# Patient Record
Sex: Male | Born: 1985 | Race: Black or African American | Hispanic: No | Marital: Married | Smoking: Never smoker
Health system: Southern US, Community
[De-identification: ages and names within clinical notes are randomized; demographics above are authoritative.]

## PROBLEM LIST (undated history)

## (undated) DIAGNOSIS — I10 Essential (primary) hypertension: Secondary | ICD-10-CM

## (undated) DIAGNOSIS — F419 Anxiety disorder, unspecified: Secondary | ICD-10-CM

---

## 2014-11-27 ENCOUNTER — Other Ambulatory Visit (INDEPENDENT_AMBULATORY_CARE_PROVIDER_SITE_OTHER): Payer: Self-pay | Admitting: Surgery

## 2014-11-27 ENCOUNTER — Other Ambulatory Visit (INDEPENDENT_AMBULATORY_CARE_PROVIDER_SITE_OTHER): Payer: Self-pay

## 2014-11-28 ENCOUNTER — Other Ambulatory Visit (INDEPENDENT_AMBULATORY_CARE_PROVIDER_SITE_OTHER): Payer: Self-pay

## 2014-12-02 ENCOUNTER — Other Ambulatory Visit: Payer: Self-pay | Admitting: Surgery

## 2014-12-02 LAB — CBC WITH DIFFERENTIAL/PLATELET
BASOS PCT: 0 % (ref 0–1)
Basophils Absolute: 0 10*3/uL (ref 0.0–0.1)
Eosinophils Absolute: 0.1 10*3/uL (ref 0.0–0.7)
Eosinophils Relative: 2 % (ref 0–5)
HEMATOCRIT: 47 % (ref 39.0–52.0)
Hemoglobin: 15.8 g/dL (ref 13.0–17.0)
LYMPHS ABS: 2.5 10*3/uL (ref 0.7–4.0)
Lymphocytes Relative: 39 % (ref 12–46)
MCH: 26.2 pg (ref 26.0–34.0)
MCHC: 33.6 g/dL (ref 30.0–36.0)
MCV: 77.9 fL — ABNORMAL LOW (ref 78.0–100.0)
MONO ABS: 0.4 10*3/uL (ref 0.1–1.0)
MPV: 10.9 fL (ref 8.6–12.4)
Monocytes Relative: 7 % (ref 3–12)
NEUTROS ABS: 3.3 10*3/uL (ref 1.7–7.7)
Neutrophils Relative %: 52 % (ref 43–77)
Platelets: 202 10*3/uL (ref 150–400)
RBC: 6.03 MIL/uL — AB (ref 4.22–5.81)
RDW: 15.3 % (ref 11.5–15.5)
WBC: 6.3 10*3/uL (ref 4.0–10.5)

## 2014-12-02 LAB — COMPREHENSIVE METABOLIC PANEL
ALT: 14 U/L (ref 0–53)
AST: 18 U/L (ref 0–37)
Albumin: 4.3 g/dL (ref 3.5–5.2)
Alkaline Phosphatase: 71 U/L (ref 39–117)
BUN: 15 mg/dL (ref 6–23)
CHLORIDE: 107 meq/L (ref 96–112)
CO2: 28 mEq/L (ref 19–32)
Calcium: 9.9 mg/dL (ref 8.4–10.5)
Creat: 1.23 mg/dL (ref 0.50–1.35)
Glucose, Bld: 120 mg/dL — ABNORMAL HIGH (ref 70–99)
Potassium: 5 mEq/L (ref 3.5–5.3)
Sodium: 145 mEq/L (ref 135–145)
Total Bilirubin: 0.6 mg/dL (ref 0.2–1.2)
Total Protein: 6.9 g/dL (ref 6.0–8.3)

## 2014-12-02 LAB — TSH: TSH: 1.427 u[IU]/mL (ref 0.350–4.500)

## 2014-12-17 ENCOUNTER — Other Ambulatory Visit (HOSPITAL_COMMUNITY): Payer: Self-pay

## 2014-12-17 ENCOUNTER — Encounter: Payer: BLUE CROSS/BLUE SHIELD | Attending: Surgery | Admitting: Dietician

## 2014-12-17 DIAGNOSIS — Z6841 Body Mass Index (BMI) 40.0 and over, adult: Secondary | ICD-10-CM | POA: Diagnosis not present

## 2014-12-17 DIAGNOSIS — Z713 Dietary counseling and surveillance: Secondary | ICD-10-CM | POA: Insufficient documentation

## 2014-12-17 NOTE — Patient Instructions (Signed)
Follow Pre-Op Goals Try Protein Shakes Call [facility] at [phone] when surgery is scheduled to enroll in Pre-Op Class  Things to remember:  Please always be honest with us. We want to support you!  If you have any questions or concerns in between appointments, please call or email Liz, Edgard Debord, or Laurie.  The diet after surgery will be high protein and low in carbohydrate.  Vitamins and calcium need to be taken for the rest of your life.  Feel free to include support people in any classes or appointments.  

## 2014-12-17 NOTE — Progress Notes (Signed)
  Pre-Op Assessment Visit:  Pre-Operative Gastric sleeve Surgery  Medical Nutrition Therapy:  Appt start time: 1120   End time:  1200.  Patient was seen on 12/17/14 for Pre-Operative Nutrition Assessment. [facility] on 12/17/14.   Preferred Learning Style:   No preference indicated   Learning Readiness:   Ready  Handouts given during visit include:  Pre-Op Goals Bariatric Surgery Protein Shakes  Samples provided and patient instructed on proper use: Bariatric Advantage Calcium citrate (orange - qty 2) Lot#: [id number] Exp: 03/2015  During the appointment today the following Pre-Op Goals were reviewed with the patient: Maintain or lose weight as instructed by your surgeon Make healthy food choices Begin to limit portion sizes Limited concentrated sugars and fried foods Keep fat/sugar in the single digits per serving on   food labels Practice CHEWING your food  (aim for 30 chews per bite or until applesauce consistency) Practice not drinking 15 minutes before, during, and 30 minutes after each meal/snack Avoid all carbonated beverages  Avoid/limit caffeinated beverages  Avoid all sugar-sweetened beverages Consume 3 meals per day; eat every 3-5 hours Make a list of non-food related activities Aim for 64-100 ounces of FLUID daily  Aim for at least 60-80 grams of PROTEIN daily Look for a liquid protein source that contain ?15 g protein and ?5 g carbohydrate  (ex: shakes, drinks, shots)  Patient-Centered Goals: -Running without pain, feeling more confident, tying shoe without being out of breath, sleeping more comfortably  Scale of 1-10: confidence (10) /importance scale (10)  Demonstrated degree of understanding via:  Teach Back  Teaching Method Utilized:  Visual Auditory Hands on  Barriers to learning/adherence to lifestyle change: none  Patient to call the [facility] to enroll in Pre-Op and Post-Op Nutrition Education when surgery date is scheduled.

## 2014-12-20 ENCOUNTER — Ambulatory Visit (HOSPITAL_COMMUNITY)
Admission: RE | Admit: 2014-12-20 | Discharge: 2014-12-20 | Disposition: A | Discharge status: Home / Self Care | Payer: BLUE CROSS/BLUE SHIELD | Source: Ambulatory Visit | Attending: Surgery | Admitting: Surgery

## 2014-12-20 DIAGNOSIS — Z6841 Body Mass Index (BMI) 40.0 and over, adult: Secondary | ICD-10-CM | POA: Insufficient documentation

## 2014-12-20 DIAGNOSIS — Z01818 Encounter for other preprocedural examination: Secondary | ICD-10-CM | POA: Insufficient documentation

## 2014-12-27 ENCOUNTER — Ambulatory Visit (HOSPITAL_COMMUNITY)
Admission: RE | Admit: 2014-12-27 | Discharge: 2014-12-27 | Disposition: A | Discharge status: Home / Self Care | Payer: BLUE CROSS/BLUE SHIELD | Source: Ambulatory Visit | Attending: Surgery | Admitting: Surgery

## 2014-12-27 ENCOUNTER — Encounter (HOSPITAL_COMMUNITY)
Admission: RE | Disposition: A | Discharge status: Home / Self Care | Payer: BLUE CROSS/BLUE SHIELD | Source: Ambulatory Visit | Attending: Surgery

## 2014-12-27 HISTORY — PX: BREATH TEK H PYLORI: SHX5422

## 2014-12-27 SURGERY — BREATH TEST, FOR HELICOBACTER PYLORI

## 2014-12-27 NOTE — Progress Notes (Signed)
   12/27/14 1025  BREATH TEK ASSESSMENT  Referring MD Dr. Ezzard StandingNewman  Time of Last PO Intake 2300  Baseline Breath At: 0822  Pranactin Given At: 0823  Post-Dose Breath At: 0838  Sample 1 3.9  Sample 2 2.7  Test Negative

## 2014-12-30 ENCOUNTER — Encounter (HOSPITAL_COMMUNITY): Payer: Self-pay | Admitting: Surgery

## 2015-01-15 ENCOUNTER — Encounter: Payer: BLUE CROSS/BLUE SHIELD | Attending: Surgery | Admitting: Dietician

## 2015-01-15 DIAGNOSIS — Z6841 Body Mass Index (BMI) 40.0 and over, adult: Secondary | ICD-10-CM | POA: Diagnosis not present

## 2015-01-15 DIAGNOSIS — Z713 Dietary counseling and surveillance: Secondary | ICD-10-CM | POA: Diagnosis not present

## 2015-01-15 NOTE — Progress Notes (Signed)
Supervised Weight Loss:  Appt start time: 1105 end time:  1120.  SWL visit #1:  Primary concerns today: Cody Byrd is here today for his 1st SWL visit in preparation for sleeve gastrectomy. He tried Premier protein shake and liked it okay, plans to explore other options. Wants to try Isopure. Having a hard time remembering to chew thoroughly. Began eating quickly when he was in [employer] and still in that habit.  Weight: 309 lbs BMI: 47.1  Plan: Practice eating slowly and chewing thoroughly  MEDICATIONS: Valsartan  Estimated energy needs: 2000-2200 calories  Progress Towards Goal(s):  In progress.   Nutritional Diagnosis:  Cody Byrd Overweight/obesity related to past poor dietary habits and physical inactivity as evidenced by patient in SWL for pending bariatric surgery following dietary guidelines for continued weight loss.     Intervention:  Nutrition counseling provided.  Monitoring/Evaluation:  Dietary intake, exercise, pre op goals, and body weight in 4 week(s).

## 2015-02-20 ENCOUNTER — Encounter: Payer: BLUE CROSS/BLUE SHIELD | Attending: Surgery | Admitting: Dietician

## 2015-02-20 VITALS — Ht 68.0 in | Wt 302.6 lb

## 2015-02-20 DIAGNOSIS — E669 Obesity, unspecified: Secondary | ICD-10-CM

## 2015-02-20 DIAGNOSIS — Z713 Dietary counseling and surveillance: Secondary | ICD-10-CM | POA: Diagnosis not present

## 2015-02-20 DIAGNOSIS — Z6841 Body Mass Index (BMI) 40.0 and over, adult: Secondary | ICD-10-CM | POA: Insufficient documentation

## 2015-02-20 NOTE — Progress Notes (Signed)
Supervised Weight Loss:  Appt start time: 1145 end time:  1200  SWL visit #2:  Primary concerns today: Cody Byrd is here today for his 2nd SWL visit in preparation for sleeve gastrectomy having lost 7 pounds since last month by increasing vegetables and reducing carbs. Still having trouble chewing thoroughly and eating slowly. Has tried replacing breakfast or dinner with a Premier protein shake. Was doing water aerobics but stopped to focus more on weight training. Plans to resume water aerobics for joint pain relief.    Weight: 302.6 lbs BMI: 46.1  Plan: Practice eating slowly and chewing thoroughly  -Try putting fork down between each bite   MEDICATIONS: Valsartan  Estimated energy needs: 2000-2200 calories  Progress Towards Goal(s):  In progress.   Nutritional Diagnosis:  [place]-3.3 Overweight/obesity related to past poor dietary habits and physical inactivity as evidenced by patient in SWL for pending bariatric surgery following dietary guidelines for continued weight loss.     Intervention:  Nutrition counseling provided.  Monitoring/Evaluation:  Dietary intake, exercise, pre op goals, and body weight in 4 week(s).

## 2015-03-19 ENCOUNTER — Encounter: Payer: BLUE CROSS/BLUE SHIELD | Attending: Surgery | Admitting: Dietician

## 2015-03-19 ENCOUNTER — Encounter: Payer: Self-pay | Admitting: Dietician

## 2015-03-19 DIAGNOSIS — Z713 Dietary counseling and surveillance: Secondary | ICD-10-CM | POA: Diagnosis not present

## 2015-03-19 DIAGNOSIS — Z6841 Body Mass Index (BMI) 40.0 and over, adult: Secondary | ICD-10-CM | POA: Insufficient documentation

## 2015-03-19 NOTE — Patient Instructions (Addendum)
Practice eating slowly and chewing thoroughly  -Try putting fork down between each bite Plan to eat 3-4 x day (instead of 2). Work on waiting 30 minutes after meals before drinking.

## 2015-03-19 NOTE — Progress Notes (Signed)
Supervised Weight Loss:  Appt start time: 1100 end time:  1115  SWL visit #3:  Primary concerns today: Cody Byrd is here today for his 3rd SWL visit in preparation for sleeve gastrectomy having lost 6 pounds since last month. Doing more cardio 3-4 x week. Has also been working on chewing. Has done some water aerobics too. Eating about 2 meals per day d/t work schedule. Tried Premier and [employer]ure and liked them ok. Drinking water and had a few beers in the past month. Not drinking during meals.  Weight: 296.9 lbs BMI: 46.0  Plan:  Practice eating slowly and chewing thoroughly  -Try putting fork down between each bite Plan to eat 3-4 x day (instead of 2). Work on waiting 30 minutes after meals before drinking.  MEDICATIONS: Valsartan  Estimated energy needs: 2000-2200 calories  Progress Towards Goal(s):  In progress.   Nutritional Diagnosis:  [place]-3.3 Overweight/obesity related to past poor dietary habits and physical inactivity as evidenced by patient in SWL for pending bariatric surgery following dietary guidelines for continued weight loss.     Intervention:  Nutrition counseling provided.  Monitoring/Evaluation:  Dietary intake, exercise, pre op goals, and body weight in 4 week(s).

## 2015-04-17 ENCOUNTER — Ambulatory Visit: Payer: BLUE CROSS/BLUE SHIELD | Admitting: Dietician

## 2015-04-22 ENCOUNTER — Encounter: Payer: Self-pay | Admitting: Dietician

## 2015-04-22 ENCOUNTER — Encounter: Payer: BLUE CROSS/BLUE SHIELD | Attending: Surgery | Admitting: Dietician

## 2015-04-22 VITALS — Ht 68.0 in | Wt 292.2 lb

## 2015-04-22 DIAGNOSIS — Z6841 Body Mass Index (BMI) 40.0 and over, adult: Secondary | ICD-10-CM | POA: Diagnosis not present

## 2015-04-22 DIAGNOSIS — E669 Obesity, unspecified: Secondary | ICD-10-CM

## 2015-04-22 DIAGNOSIS — Z713 Dietary counseling and surveillance: Secondary | ICD-10-CM | POA: Diagnosis not present

## 2015-04-22 NOTE — Patient Instructions (Addendum)
Practice eating slowly and chewing thoroughly  -Try putting fork down between each bite Continue to eat 3-4 x day. Plan to do cardio 3-4 x for 45-60 minutes. [facility] at [phone] when surgery is scheduled to enroll in Pre-Op Class

## 2015-04-22 NOTE — Progress Notes (Signed)
Supervised Weight Loss:  Appt start time: 200 end time:  215  SWL visit #4:  Primary concerns today: Cody Byrd is here today for his 4th SWL visit in preparation for sleeve gastrectomy having lost 4 pounds since last month. Has been doing cardio 2 x week this month. Still working on chewing and putting his fork down between bites. Has been improving portion control. Waiting 30 minutes to drink after meals.  Added a protein shake so having 3 meals per day.   Weight: 292.2 lbs BMI: 44.3  Plan:  Practice eating slowly and chewing thoroughly  -Try putting fork down between each bite Continue to eat 3-4 x day. Plan to do cardio 3-4 x for 45-60 minutes. [facility] [phone] when surgery is scheduled to enroll in Pre-Op Class  MEDICATIONS: Valsartan  Estimated energy needs: 2000-2200 calories  Progress Towards Goal(s):  In progress.   Nutritional Diagnosis:  [place]-3.3 Overweight/obesity related to past poor dietary habits and physical inactivity as evidenced by patient in SWL for pending bariatric surgery following dietary guidelines for continued weight loss.     Intervention:  Nutrition counseling provided.  Monitoring/Evaluation:  Dietary intake, exercise, pre op goals, and body weight in 4 week(s).

## 2015-05-21 ENCOUNTER — Ambulatory Visit: Payer: BLUE CROSS/BLUE SHIELD | Admitting: Dietician

## 2015-06-16 ENCOUNTER — Encounter: Payer: BLUE CROSS/BLUE SHIELD | Attending: Surgery

## 2015-06-16 DIAGNOSIS — Z6841 Body Mass Index (BMI) 40.0 and over, adult: Secondary | ICD-10-CM | POA: Insufficient documentation

## 2015-06-16 DIAGNOSIS — Z713 Dietary counseling and surveillance: Secondary | ICD-10-CM | POA: Insufficient documentation

## 2015-06-16 NOTE — Progress Notes (Signed)
  Pre-Operative Nutrition Class:  Appt start time: 830   End time:  930.  Patient was seen on 06/16/2015 for Pre-Operative Bariatric Surgery Education at the [facility].   Surgery date:  Surgery type: Sleeve gastrectomy Start weight at [facility]: 309 lbs on 12/17/14 Weight today: 291 lbs  TANITA  BODY COMP RESULTS  06/16/15   BMI (kg/m^2) 44.2   Fat Mass (lbs) 153   Fat Free Mass (lbs) 138   Total Body Water (lbs) 101   Samples given per MNT protocol. Patient educated on appropriate usage: Premier protein shake (chocolate - qty 1) Lot #: [id number] Exp: 02/2016  Unjury protein powder (chicken soup - qty 1) Lot #: [id number] Exp: 05/2016  Celebrate Calcium citrate chew (caramel - qty 1) Lot #: [id number] Exp: 01/2017  The following the learning objectives were met by the patient during this course:  Identify Pre-Op Dietary Goals and will begin 2 weeks pre-operatively  Identify appropriate sources of fluids and proteins   State protein recommendations and appropriate sources pre and post-operatively  Identify Post-Operative Dietary Goals and will follow for 2 weeks post-operatively  Identify appropriate multivitamin and calcium sources  Describe the need for physical activity post-operatively and will follow MD recommendations  State when to call healthcare provider regarding medication questions or post-operative complications  Handouts given during class include:  Pre-Op Bariatric Surgery Diet Handout  Protein Shake Handout  Post-Op Bariatric Surgery Nutrition Handout  BELT Program Information Flyer  Support Group Information Flyer  [facility] Bariatric Supplements Price List  Follow-Up Plan: Patient will follow-up at [facility] 2 weeks post operatively for diet advancement per MD.

## 2015-06-19 ENCOUNTER — Ambulatory Visit: Payer: BLUE CROSS/BLUE SHIELD | Admitting: Dietician

## 2015-06-19 ENCOUNTER — Other Ambulatory Visit: Payer: Self-pay | Admitting: Surgery

## 2015-07-02 NOTE — Patient Instructions (Addendum)
YOUR PROCEDURE IS SCHEDULED ON :  07/07/15  REPORT TO [facility] MAIN ENTRANCE FOLLOW SIGNS TO EAST ELEVATOR - GO TO 3rd FLOOR CHECK IN AT 3 EAST NURSES STATION (SHORT STAY) AT:  10:45 AM  CALL THIS NUMBER IF YOU HAVE PROBLEMS THE MORNING OF SURGERY [phone]  REMEMBER:ONLY 1 PER PERSON MAY GO TO SHORT STAY WITH YOU TO GET READY THE MORNING OF YOUR SURGERY  DO NOT EAT FOOD OR DRINK LIQUIDS AFTER MIDNIGHT  TAKE THESE MEDICINES THE MORNING OF SURGERY: NONE  YOU MAY NOT HAVE ANY METAL ON YOUR BODY INCLUDING HAIR PINS AND PIERCING'S. DO NOT WEAR JEWELRY, MAKEUP, LOTIONS, POWDERS OR PERFUMES. DO NOT WEAR NAIL POLISH. DO NOT SHAVE 48 HRS PRIOR TO SURGERY. MEN MAY SHAVE FACE AND NECK.  DO NOT BRING VALUABLES TO HOSPITAL. [facility] IS NOT RESPONSIBLE FOR VALUABLES.  CONTACTS, DENTURES OR PARTIALS MAY NOT BE WORN TO SURGERY. LEAVE SUITCASE IN CAR. CAN BE BROUGHT TO ROOM AFTER SURGERY.  PATIENTS DISCHARGED THE DAY OF SURGERY WILL NOT BE ALLOWED TO DRIVE HOME.  PLEASE READ OVER THE FOLLOWING INSTRUCTION SHEETS _________________________________________________________________________________                                          [facility] - PREPARING FOR SURGERY  Before surgery, you can play an important role.  Because skin is not sterile, your skin needs to be as free of germs as possible.  You can reduce the number of germs on your skin by washing with CHG (chlorahexidine gluconate) soap before surgery.  CHG is an antiseptic cleaner which kills germs and bonds with the skin to continue killing germs even after washing. Please DO NOT use if you have an allergy to CHG or antibacterial soaps.  If your skin becomes reddened/irritated stop using the CHG and inform your nurse when you arrive at Short Stay. Do not shave (including legs and underarms) for at least 48 hours prior to the first CHG shower.  You may shave your face. Please follow these instructions  carefully:   1.  Shower with CHG Soap the night before surgery and the  morning of Surgery.   2.  If you choose to wash your hair, wash your hair first as usual with your  normal  Shampoo.   3.  After you shampoo, rinse your hair and body thoroughly to remove the  shampoo.                                         4.  Use CHG as you would any other liquid soap.  You can apply chg directly  to the skin and wash . Gently wash with scrungie or clean wascloth    5.  Apply the CHG Soap to your body ONLY FROM THE NECK DOWN.   Do not use on open                           Wound or open sores. Avoid contact with eyes, ears mouth and genitals (private parts).                        Genitals (private parts) with your normal soap.  6.  Wash thoroughly, paying special attention to the area where your surgery  will be performed.   7.  Thoroughly rinse your body with warm water from the neck down.   8.  DO NOT shower/wash with your normal soap after using and rinsing off  the CHG Soap .                9.  Pat yourself dry with a clean towel.             10.  Wear clean night clothes to bed after shower             11.  Place clean sheets on your bed the night of your first shower and do not  sleep with pets.  Day of Surgery : Do not apply any lotions/deodorants the morning of surgery.  Please wear clean clothes to the hospital/surgery center.  FAILURE TO FOLLOW THESE INSTRUCTIONS MAY RESULT IN THE CANCELLATION OF YOUR SURGERY    PATIENT SIGNATURE_________________________________  ______________________________________________________________________  MAY HAVE CLEAR LIQUIDS UNTIL 6:45 AM    CLEAR LIQUID DIET   Foods Allowed                                                                     Foods Excluded  Coffee and tea, regular and decaf                             liquids that you cannot  Plain Jell-O in any flavor                                             see through  such as: Fruit ices (not with fruit pulp)                                     milk, soups, orange juice  Iced Popsicles                                    All solid food Carbonated beverages, regular and diet                                    Cranberry, grape and apple juices Sports drinks like Gatorade Lightly seasoned clear broth or consume(fat free) Sugar, honey syrup   _____________________________________________________________________

## 2015-07-03 ENCOUNTER — Encounter (HOSPITAL_COMMUNITY)
Admission: RE | Admit: 2015-07-03 | Discharge: 2015-07-03 | Disposition: A | Discharge status: Home / Self Care | Payer: BLUE CROSS/BLUE SHIELD | Source: Ambulatory Visit | Attending: Surgery | Admitting: Surgery

## 2015-07-03 ENCOUNTER — Encounter (HOSPITAL_COMMUNITY): Payer: Self-pay

## 2015-07-03 ENCOUNTER — Encounter (INDEPENDENT_AMBULATORY_CARE_PROVIDER_SITE_OTHER): Payer: Self-pay

## 2015-07-03 DIAGNOSIS — Z01818 Encounter for other preprocedural examination: Secondary | ICD-10-CM | POA: Insufficient documentation

## 2015-07-03 HISTORY — DX: Anxiety disorder, unspecified: F41.9

## 2015-07-03 HISTORY — DX: Essential (primary) hypertension: I10

## 2015-07-03 LAB — COMPREHENSIVE METABOLIC PANEL
ALK PHOS: 52 U/L (ref 38–126)
ALT: 24 U/L (ref 17–63)
AST: 37 U/L (ref 15–41)
Albumin: 4.7 g/dL (ref 3.5–5.0)
Anion gap: 10 (ref 5–15)
BUN: 21 mg/dL — ABNORMAL HIGH (ref 6–20)
CALCIUM: 9.5 mg/dL (ref 8.9–10.3)
CO2: 24 mmol/L (ref 22–32)
CREATININE: 1.32 mg/dL — AB (ref 0.61–1.24)
Chloride: 105 mmol/L (ref 101–111)
GFR calc Af Amer: >60 mL/min (ref >60)
GFR calc non Af Amer: >60 mL/min (ref >60)
Glucose, Bld: 96 mg/dL (ref 65–99)
Potassium: 4.7 mmol/L (ref 3.5–5.1)
SODIUM: 139 mmol/L (ref 135–145)
Total Bilirubin: 0.7 mg/dL (ref 0.3–1.2)
Total Protein: 7.2 g/dL (ref 6.5–8.1)

## 2015-07-03 LAB — CBC WITH DIFFERENTIAL/PLATELET
BASOS ABS: 0 10*3/uL (ref 0.0–0.1)
BASOS PCT: 0 % (ref 0–1)
EOS ABS: 0.1 10*3/uL (ref 0.0–0.7)
Eosinophils Relative: 2 % (ref 0–5)
HCT: 44.5 % (ref 39.0–52.0)
HEMOGLOBIN: 15.2 g/dL (ref 13.0–17.0)
Lymphocytes Relative: 48 % — ABNORMAL HIGH (ref 12–46)
Lymphs Abs: 2.6 10*3/uL (ref 0.7–4.0)
MCH: 25.9 pg — ABNORMAL LOW (ref 26.0–34.0)
MCHC: 34.2 g/dL (ref 30.0–36.0)
MCV: 75.9 fL — ABNORMAL LOW (ref 78.0–100.0)
Monocytes Absolute: 0.3 10*3/uL (ref 0.1–1.0)
Monocytes Relative: 6 % (ref 3–12)
NEUTROS PCT: 44 % (ref 43–77)
Neutro Abs: 2.4 10*3/uL (ref 1.7–7.7)
Platelets: 194 10*3/uL (ref 150–400)
RBC: 5.86 MIL/uL — AB (ref 4.22–5.81)
RDW: 14.2 % (ref 11.5–15.5)
WBC: 5.4 10*3/uL (ref 4.0–10.5)

## 2015-07-03 NOTE — Progress Notes (Signed)
   07/03/15 1417  OBSTRUCTIVE SLEEP APNEA  Have you ever been diagnosed with sleep apnea through a sleep study? No  Do you snore loudly (loud enough to be heard through closed doors)?  0  Do you often feel tired, fatigued, or sleepy during the daytime? 1  Has anyone observed you stop breathing during your sleep? 1  Do you have, or are you being treated for high blood pressure? 1  BMI more than 35 kg/m2? 1  Age over 28 years old? 0  Neck circumference greater than 40 cm/16 inches? 1  Gender: 1  Obstructive Sleep Apnea Score 6

## 2015-07-06 NOTE — H&P (Signed)
Cody Byrd 06/19/2015 1:37 PM Location: [facility] Patient #: [id number] DOB: [birth year] Married / Language: English / Race: Black or African American Male  History of Present Illness   Patient words: preop.   The patient is a 29 year old male who presents for a bariatric surgery evaluation.   He does not have a PCP.  He comes by himself.   His initial weight is 305. His BMI is 47.54. He has lost almost 18 pounds since I saw him. He is ready. We talked about his wife, who is going through the process of getting approved. She just had her last surpervised diet.  He has been trying to loose weight. I think that he has a good handle on the surgery. He has looked at several on line videos and had some specific questions about the surgery. I reviewed the operation with him. He is to start his pre op diet soon.  Per the 1991 NIH Consensus Statement, the patient is a candidate for bariatric surgery. The patient attended our initial information session and reviewed the types of bariatric surgery.  The patient is interested in the sleeve gastrectomy. I discussed with the patient the indications and risks of bariatric surgery. The potential risks of surgery include, but are not limited to, bleeding, infection, leak from the bowel, DVT and PE, open surgery, long term nutrition consequences, and death. The patient understands the importance of compliance and long term follow-up with our group after surgery.  UGI - 12/20/2014 - negative [place] of abdomen - 12/20/2014 - fatty liver Psych - he saw Dr. Cyndia Skeeters - 01/30/2015  History of weight issues: The patient's wieght issues began when he got out of the [employer]. He said he was not as active, but kept eating the same amount. Even when he diets, he'll loose a little, then gain more wieght back. The patient is interested in laparoscopic sleeve resection. He does not know a patient who has had the lap sleeve  resection. I presented the information session he went to about 2 months ago. He has tried the [employer], intermittent fasting, liquid diet, and protein shakes. He also tried an over-the-counter red pill, but does not remember the name of it. I am to see his wife tomorrow for consideration for weight loss surgery.  Past Medical History 1. HTN - he says that he treats it with mustard. 2. Musculoskelatal - he has back and knee pain. He was seen in [employer] before he left, but has not seen someone locally. 3. Psych - Dr. Cyndia Skeeters 4. Has claustrophobia  Socail History: Married. His wife is Print production planner. I am seeing his wife for a sleeve also. He works for [employer]. Addendum Note(Hulbert Branscome H. Ezzard Standing MD; 06/19/2015 2:23 PM) Dr. Burney Gauze is his PCP now. DN 06/19/2015   Allergies (Ammie Eversole, LPN; 12/16/3084 5:78 PM) No Known Drug Allergies01/13/2016  Medication History (Ammie Eversole, LPN; 02/19/9628 5:28 PM) Valsartan (80MG  Tablet, Oral) Active. No Current Medications (Taken starting 06/19/2015) Medications Reconciled  Review of Systems Onalee Hua H. Ezzard Standing MD; 06/19/2015 1:53 PM) General Present- Fatigue and Weight Gain. Not Present- Appetite Loss, Chills, Fever, Night Sweats and Weight Loss. Skin Present- Dryness. Not Present- Change in Wart/Mole, Hives, Jaundice, New Lesions, Non-Healing Wounds, Rash and Ulcer. HEENT Present- Visual Disturbances. Not Present- Earache, Hearing Loss, Hoarseness, Nose Bleed, Oral Ulcers, Ringing in the Ears, Seasonal Allergies, Sinus Pain, Sore Throat, Wears glasses/contact lenses and Yellow Eyes. Respiratory Present- Snoring. Not Present- Bloody sputum, Chronic Cough, Difficulty  Breathing and Wheezing. Breast Not Present- Breast Mass, Breast Pain, Nipple Discharge and Skin Changes. Cardiovascular Present- Difficulty Breathing Lying Down and Leg Cramps. Not Present- Chest Pain, Palpitations, Rapid Heart Rate, Shortness of Breath and Swelling of  Extremities. Gastrointestinal Present- Excessive gas. Not Present- Abdominal Pain, Bloating, Bloody Stool, Change in Bowel Habits, Chronic diarrhea, Constipation, Difficulty Swallowing, Gets full quickly at meals, Hemorrhoids, Indigestion, Nausea, Rectal Pain and Vomiting. Male Genitourinary Present- Nocturia. Not Present- Blood in Urine, Change in Urinary Stream, Frequency, Impotence, Painful Urination, Urgency and Urine Leakage. Musculoskeletal Present- Back Pain, Joint Pain and Joint Stiffness. Not Present- Muscle Pain, Muscle Weakness and Swelling of Extremities. Neurological Not Present- Decreased Memory, Fainting, Headaches, Numbness, Seizures, Tingling, Tremor, Trouble walking and Weakness. Endocrine Present- Hair Changes. Not Present- Cold Intolerance, Excessive Hunger, Heat Intolerance and New Diabetes. Hematology Not Present- Easy Bruising, Excessive bleeding, Gland problems, HIV and Persistent Infections.   Vitals (Ammie Eversole LPN; 11/20/1094 0:45 PM) 06/19/2015 1:37 PM Weight: 287.8 lb Height: 67.25in Body Surface Area: 2.49 m Body Mass Index: 44.74 kg/m Temp.: 98.46F(Oral)  Pulse: 78 (Regular)  BP: 126/88 (Sitting, Left Arm, Standard)  Physical Exam  General: Obese WN AA alert and generally healthy appearing. HEENT: Normal. Pupils equal. Good dentition.  Neck: Supple. No mass. No thyroid mass.  Lymph Nodes: No supraclavicular or cervical nodes.  Lungs: Clear to auscultation and symmetric breath sounds. He does have breasts secondary to his weight bilaterally. Heart: RRR. No murmur or rub.  Abdomen: Soft. No mass. No tenderness. No hernia. Normal bowel sounds. No abdominal scars. He is about 1/2 apple and 1/2 pear.  Extremities: Good strength and ROM in upper and lower extremities.  Neurologic: Grossly intact to motor and sensory function. Psychiatric: Has normal mood and affect. Behavior is normal.  Assessment & Plan  1.  MORBID OBESITY WITH BMI  OF 45.0-49.9, ADULT (278.01  E66.01)  Impression: He is scheduled for sleeve gastrectomy - 07/07/2015   He is on the pre op diet.  Gave him his pain meds: OxyCODONE HCl /5ML, 5-10 Milliliter every four hours, as needed, 200 Milliliter, 06/19/2015, No Refill.  2. HTN - he says that he treats it with mustard. 3. Musculoskelatal - he has back and knee pain. He was [place] in [employer] before he left, but has not seen someone locally. 4. Has claustrophobia  5.  I am seeing his wife, Ester Mabe, for weight loss surgery.  Ovidio Kin, MD, [facility] Pager: [phone] Office phone:  [phone]

## 2015-07-07 ENCOUNTER — Encounter (HOSPITAL_COMMUNITY): Payer: Self-pay | Admitting: Anesthesiology

## 2015-07-07 ENCOUNTER — Inpatient Hospital Stay (HOSPITAL_COMMUNITY): Payer: BLUE CROSS/BLUE SHIELD | Admitting: Anesthesiology

## 2015-07-07 ENCOUNTER — Inpatient Hospital Stay (HOSPITAL_COMMUNITY)
Admission: RE | Admit: 2015-07-07 | Discharge: 2015-07-09 | DRG: 621 | Disposition: A | Discharge status: Home / Self Care | Payer: BLUE CROSS/BLUE SHIELD | Source: Ambulatory Visit | Attending: Surgery | Admitting: Surgery

## 2015-07-07 ENCOUNTER — Encounter (HOSPITAL_COMMUNITY)
Admission: RE | Disposition: A | Discharge status: Home / Self Care | Payer: Self-pay | Source: Ambulatory Visit | Attending: Surgery

## 2015-07-07 DIAGNOSIS — K660 Peritoneal adhesions (postprocedural) (postinfection): Secondary | ICD-10-CM | POA: Diagnosis present

## 2015-07-07 DIAGNOSIS — Z713 Dietary counseling and surveillance: Secondary | ICD-10-CM | POA: Diagnosis present

## 2015-07-07 DIAGNOSIS — M25569 Pain in unspecified knee: Secondary | ICD-10-CM | POA: Diagnosis present

## 2015-07-07 DIAGNOSIS — Z01812 Encounter for preprocedural laboratory examination: Secondary | ICD-10-CM | POA: Diagnosis not present

## 2015-07-07 DIAGNOSIS — Z6841 Body Mass Index (BMI) 40.0 and over, adult: Secondary | ICD-10-CM | POA: Diagnosis not present

## 2015-07-07 DIAGNOSIS — R110 Nausea: Secondary | ICD-10-CM | POA: Diagnosis present

## 2015-07-07 DIAGNOSIS — Z79899 Other long term (current) drug therapy: Secondary | ICD-10-CM | POA: Diagnosis not present

## 2015-07-07 DIAGNOSIS — F40240 Claustrophobia: Secondary | ICD-10-CM | POA: Diagnosis present

## 2015-07-07 DIAGNOSIS — K760 Fatty (change of) liver, not elsewhere classified: Secondary | ICD-10-CM | POA: Diagnosis present

## 2015-07-07 DIAGNOSIS — I10 Essential (primary) hypertension: Secondary | ICD-10-CM | POA: Diagnosis present

## 2015-07-07 DIAGNOSIS — Z9884 Bariatric surgery status: Secondary | ICD-10-CM

## 2015-07-07 DIAGNOSIS — Z01810 Encounter for preprocedural cardiovascular examination: Secondary | ICD-10-CM | POA: Diagnosis not present

## 2015-07-07 HISTORY — PX: LAPAROSCOPIC GASTRIC SLEEVE RESECTION: SHX5895

## 2015-07-07 LAB — HEMOGLOBIN AND HEMATOCRIT, BLOOD
HCT: 43.2 % (ref 39.0–52.0)
Hemoglobin: 14.6 g/dL (ref 13.0–17.0)

## 2015-07-07 LAB — CREATININE, SERUM
Creatinine, Ser: 1.32 mg/dL — ABNORMAL HIGH (ref 0.61–1.24)
GFR calc Af Amer: >60 mL/min (ref >60)

## 2015-07-07 SURGERY — GASTRECTOMY, SLEEVE, LAPAROSCOPIC
Anesthesia: General

## 2015-07-07 MED ORDER — TISSEEL VH 10 ML EX KIT
PACK | CUTANEOUS | Status: AC
  Filled 2015-07-07: qty 1

## 2015-07-07 MED ORDER — MIDAZOLAM HCL 5 MG/5ML IJ SOLN
INTRAMUSCULAR | Status: DC | PRN
  Administered 2015-07-07: 2 mg via INTRAVENOUS

## 2015-07-07 MED ORDER — PROPOFOL 10 MG/ML IV BOLUS
INTRAVENOUS | Status: AC
  Filled 2015-07-07: qty 20

## 2015-07-07 MED ORDER — FENTANYL CITRATE (PF) 250 MCG/5ML IJ SOLN
INTRAMUSCULAR | Status: AC
  Filled 2015-07-07: qty 25

## 2015-07-07 MED ORDER — BUPIVACAINE HCL 0.25 % IJ SOLN
INTRAMUSCULAR | Status: DC | PRN
  Administered 2015-07-07: 30 mL

## 2015-07-07 MED ORDER — ONDANSETRON HCL 4 MG/2ML IJ SOLN
4.0000 mg | Freq: Four times a day (QID) | INTRAMUSCULAR | Status: DC | PRN
Start: 2015-07-07 — End: 2015-07-07

## 2015-07-07 MED ORDER — UNJURY VANILLA POWDER
2.0000 [oz_av] | Freq: Four times a day (QID) | ORAL | Status: DC
  Administered 2015-07-09: 2 [oz_av] via ORAL

## 2015-07-07 MED ORDER — DEXTROSE 5 % IV SOLN
INTRAVENOUS | Status: AC
  Filled 2015-07-07: qty 2

## 2015-07-07 MED ORDER — NEOSTIGMINE METHYLSULFATE 10 MG/10ML IV SOLN
INTRAVENOUS | Status: DC | PRN
  Administered 2015-07-07: 5 mg via INTRAVENOUS

## 2015-07-07 MED ORDER — OXYCODONE HCL 5 MG PO TABS: 5.0000 mg | ORAL_TABLET | Freq: Once | ORAL | Status: DC | PRN

## 2015-07-07 MED ORDER — ONDANSETRON HCL 4 MG/2ML IJ SOLN
INTRAMUSCULAR | Status: AC
  Filled 2015-07-07: qty 2

## 2015-07-07 MED ORDER — GLYCOPYRROLATE 0.2 MG/ML IJ SOLN
INTRAMUSCULAR | Status: DC | PRN
  Administered 2015-07-07: .7 mg via INTRAVENOUS

## 2015-07-07 MED ORDER — ROCURONIUM BROMIDE 100 MG/10ML IV SOLN
INTRAVENOUS | Status: DC | PRN
  Administered 2015-07-07: 20 mg via INTRAVENOUS
  Administered 2015-07-07: 10 mg via INTRAVENOUS
  Administered 2015-07-07: 50 mg via INTRAVENOUS

## 2015-07-07 MED ORDER — CETYLPYRIDINIUM CHLORIDE 0.05 % MT LIQD
7.0000 mL | Freq: Two times a day (BID) | OROMUCOSAL | Status: DC
  Administered 2015-07-08: 7 mL via OROMUCOSAL

## 2015-07-07 MED ORDER — HEPARIN SODIUM (PORCINE) 5000 UNIT/ML IJ SOLN
5000.0000 [IU] | INTRAMUSCULAR | Status: AC
  Administered 2015-07-07: 5000 [IU] via SUBCUTANEOUS
  Filled 2015-07-07: qty 1

## 2015-07-07 MED ORDER — OXYCODONE HCL 5 MG/5ML PO SOLN
5.0000 mg | ORAL | Status: DC | PRN
Start: 2015-07-08 — End: 2015-07-09
  Administered 2015-07-09: 10 mg via ORAL
  Filled 2015-07-07: qty 10

## 2015-07-07 MED ORDER — DEXTROSE 5 % IV SOLN
2.0000 g | INTRAVENOUS | Status: AC
  Administered 2015-07-07: 2 g via INTRAVENOUS

## 2015-07-07 MED ORDER — ONDANSETRON HCL 4 MG/2ML IJ SOLN
4.0000 mg | INTRAMUSCULAR | Status: DC | PRN
  Administered 2015-07-08 (×2): 4 mg via INTRAVENOUS
  Filled 2015-07-07 (×2): qty 2

## 2015-07-07 MED ORDER — PROMETHAZINE HCL 25 MG/ML IJ SOLN
6.2500 mg | INTRAMUSCULAR | Status: AC | PRN
Start: 2015-07-07 — End: 2015-07-07
  Administered 2015-07-07 (×2): 6.25 mg via INTRAVENOUS

## 2015-07-07 MED ORDER — UNJURY CHICKEN SOUP POWDER
2.0000 [oz_av] | Freq: Four times a day (QID) | ORAL | Status: DC
  Administered 2015-07-09: 2 [oz_av] via ORAL

## 2015-07-07 MED ORDER — PROMETHAZINE HCL 25 MG/ML IJ SOLN
INTRAMUSCULAR | Status: AC
  Filled 2015-07-07: qty 1

## 2015-07-07 MED ORDER — MIDAZOLAM HCL 2 MG/2ML IJ SOLN
INTRAMUSCULAR | Status: AC
  Filled 2015-07-07: qty 4

## 2015-07-07 MED ORDER — KCL IN DEXTROSE-NACL 20-5-0.45 MEQ/L-%-% IV SOLN
INTRAVENOUS | Status: DC
  Administered 2015-07-07 – 2015-07-09 (×6): via INTRAVENOUS
  Filled 2015-07-07 (×9): qty 1000

## 2015-07-07 MED ORDER — HYDROMORPHONE HCL 1 MG/ML IJ SOLN
0.2500 mg | INTRAMUSCULAR | Status: DC | PRN
  Administered 2015-07-07 (×2): 0.5 mg via INTRAVENOUS

## 2015-07-07 MED ORDER — HYDROMORPHONE HCL 1 MG/ML IJ SOLN
INTRAMUSCULAR | Status: AC
  Filled 2015-07-07: qty 1

## 2015-07-07 MED ORDER — TISSEEL VH 10 ML EX KIT
PACK | CUTANEOUS | Status: DC | PRN
  Administered 2015-07-07: 10 mL

## 2015-07-07 MED ORDER — PROPOFOL 10 MG/ML IV BOLUS
INTRAVENOUS | Status: DC | PRN
  Administered 2015-07-07: 220 mg via INTRAVENOUS

## 2015-07-07 MED ORDER — CHLORHEXIDINE GLUCONATE 4 % EX LIQD: 60.0000 mL | Freq: Once | CUTANEOUS | Status: DC

## 2015-07-07 MED ORDER — ONDANSETRON HCL 4 MG/2ML IJ SOLN
INTRAMUSCULAR | Status: DC | PRN
  Administered 2015-07-07: 4 mg via INTRAVENOUS

## 2015-07-07 MED ORDER — LACTATED RINGERS IV SOLN
INTRAVENOUS | Status: DC
  Administered 2015-07-07: 16:00:00 via INTRAVENOUS

## 2015-07-07 MED ORDER — UNJURY CHOCOLATE CLASSIC POWDER: 2.0000 [oz_av] | Freq: Four times a day (QID) | ORAL | Status: DC

## 2015-07-07 MED ORDER — DEXAMETHASONE SODIUM PHOSPHATE 10 MG/ML IJ SOLN
INTRAMUSCULAR | Status: DC | PRN
  Administered 2015-07-07: 10 mg via INTRAVENOUS

## 2015-07-07 MED ORDER — MORPHINE SULFATE (PF) 2 MG/ML IV SOLN
2.0000 mg | INTRAVENOUS | Status: DC | PRN
  Administered 2015-07-07 – 2015-07-08 (×7): 2 mg via INTRAVENOUS
  Filled 2015-07-07 (×7): qty 1

## 2015-07-07 MED ORDER — LACTATED RINGERS IR SOLN
Status: DC | PRN
  Administered 2015-07-07: 1000 mL

## 2015-07-07 MED ORDER — ROCURONIUM BROMIDE 100 MG/10ML IV SOLN
INTRAVENOUS | Status: AC
  Filled 2015-07-07: qty 1

## 2015-07-07 MED ORDER — FENTANYL CITRATE (PF) 100 MCG/2ML IJ SOLN
INTRAMUSCULAR | Status: DC | PRN
Start: 2015-07-07 — End: 2015-07-07
  Administered 2015-07-07 (×5): 50 ug via INTRAVENOUS
  Administered 2015-07-07: 100 ug via INTRAVENOUS
  Administered 2015-07-07: 50 ug via INTRAVENOUS

## 2015-07-07 MED ORDER — ACETAMINOPHEN 160 MG/5ML PO SOLN: 650.0000 mg | ORAL | Status: DC | PRN

## 2015-07-07 MED ORDER — LACTATED RINGERS IV SOLN
INTRAVENOUS | Status: DC | PRN
  Administered 2015-07-07 (×2): via INTRAVENOUS

## 2015-07-07 MED ORDER — PROMETHAZINE HCL 25 MG/ML IJ SOLN: 25.0000 mg | Freq: Four times a day (QID) | INTRAMUSCULAR | Status: DC | PRN

## 2015-07-07 MED ORDER — LIDOCAINE HCL (CARDIAC) 20 MG/ML IV SOLN
INTRAVENOUS | Status: AC
  Filled 2015-07-07: qty 5

## 2015-07-07 MED ORDER — CHLORHEXIDINE GLUCONATE 0.12 % MT SOLN
15.0000 mL | Freq: Two times a day (BID) | OROMUCOSAL | Status: DC
  Administered 2015-07-07 – 2015-07-09 (×5): 15 mL via OROMUCOSAL
  Filled 2015-07-07 (×5): qty 15

## 2015-07-07 MED ORDER — HEPARIN SODIUM (PORCINE) 5000 UNIT/ML IJ SOLN
5000.0000 [IU] | Freq: Three times a day (TID) | INTRAMUSCULAR | Status: DC
  Administered 2015-07-07 – 2015-07-09 (×5): 5000 [IU] via SUBCUTANEOUS
  Filled 2015-07-07 (×8): qty 1

## 2015-07-07 MED ORDER — LIDOCAINE HCL (CARDIAC) 20 MG/ML IV SOLN
INTRAVENOUS | Status: DC | PRN
  Administered 2015-07-07: 20 mg via INTRAVENOUS
  Administered 2015-07-07: 60 mg via INTRAVENOUS

## 2015-07-07 MED ORDER — OXYCODONE HCL 5 MG/5ML PO SOLN
5.0000 mg | Freq: Once | ORAL | Status: DC | PRN
  Filled 2015-07-07: qty 5

## 2015-07-07 MED ORDER — METOCLOPRAMIDE HCL 5 MG/ML IJ SOLN
INTRAMUSCULAR | Status: DC | PRN
  Administered 2015-07-07: 10 mg via INTRAVENOUS

## 2015-07-07 MED ORDER — ACETAMINOPHEN 160 MG/5ML PO SOLN: 325.0000 mg | ORAL | Status: DC | PRN

## 2015-07-07 SURGICAL SUPPLY — 59 items
APPLICATOR COTTON TIP 6IN STRL (MISCELLANEOUS) IMPLANT
APPLIER CLIP ROT 10 11.4 M/L (STAPLE)
APPLIER CLIP ROT 13.4 12 LRG (CLIP)
BLADE SURG 15 STRL LF DISP TIS (BLADE) ×1 IMPLANT
BLADE SURG 15 STRL SS (BLADE) ×2
CABLE HIGH FREQUENCY MONO STRZ (ELECTRODE) ×3 IMPLANT
CHLORAPREP W/TINT 26ML (MISCELLANEOUS) ×3 IMPLANT
CLIP APPLIE ROT 10 11.4 M/L (STAPLE) IMPLANT
CLIP APPLIE ROT 13.4 12 LRG (CLIP) IMPLANT
COVER SURGICAL LIGHT HANDLE (MISCELLANEOUS) ×3 IMPLANT
DEVICE SUTURE ENDOST 10MM (ENDOMECHANICALS) IMPLANT
DEVICE TROCAR PUNCTURE CLOSURE (ENDOMECHANICALS) ×3 IMPLANT
DISSECTOR BLUNT TIP ENDO 5MM (MISCELLANEOUS) IMPLANT
DRAPE CAMERA CLOSED 9X96 (DRAPES) ×3 IMPLANT
DRAPE UTILITY XL STRL (DRAPES) ×6 IMPLANT
ELECT REM PT RETURN 9FT ADLT (ELECTROSURGICAL) ×3
ELECTRODE REM PT RTRN 9FT ADLT (ELECTROSURGICAL) ×1 IMPLANT
GAUZE SPONGE 4X4 12PLY STRL (GAUZE/BANDAGES/DRESSINGS) IMPLANT
GLOVE SURG SIGNA 7.5 PF LTX (GLOVE) ×18 IMPLANT
GOWN STRL REUS W/TWL XL LVL3 (GOWN DISPOSABLE) ×12 IMPLANT
HOVERMATT SINGLE USE (MISCELLANEOUS) ×3 IMPLANT
KIT BASIN OR (CUSTOM PROCEDURE TRAY) ×3 IMPLANT
LIQUID BAND (GAUZE/BANDAGES/DRESSINGS) IMPLANT
NEEDLE SPNL 22GX3.5 QUINCKE BK (NEEDLE) ×3 IMPLANT
PACK UNIVERSAL I (CUSTOM PROCEDURE TRAY) ×3 IMPLANT
PEN SKIN MARKING BROAD (MISCELLANEOUS) ×3 IMPLANT
RELOAD STAPLER BLUE 60MM (STAPLE) ×2 IMPLANT
RELOAD STAPLER GOLD 60MM (STAPLE) ×1 IMPLANT
RELOAD STAPLER GREEN 60MM (STAPLE) ×2 IMPLANT
SCISSORS LAP 5X35 DISP (ENDOMECHANICALS) IMPLANT
SEALANT SURGICAL APPL DUAL CAN (MISCELLANEOUS) ×3 IMPLANT
SET IRRIG TUBING LAPAROSCOPIC (IRRIGATION / IRRIGATOR) ×3 IMPLANT
SHEARS CURVED HARMONIC AC 45CM (MISCELLANEOUS) ×3 IMPLANT
SLEEVE ADV FIXATION 5X100MM (TROCAR) ×3 IMPLANT
SLEEVE GASTRECTOMY 36FR VISIGI (MISCELLANEOUS) ×3 IMPLANT
SOLUTION ANTI FOG 6CC (MISCELLANEOUS) ×3 IMPLANT
SPONGE LAP 18X18 X RAY DECT (DISPOSABLE) ×3 IMPLANT
STAPLER ECHELON LONG 60 440 (INSTRUMENTS) ×3 IMPLANT
STAPLER RELOAD BLUE 60MM (STAPLE) ×6
STAPLER RELOAD GOLD 60MM (STAPLE) ×3
STAPLER RELOAD GREEN 60MM (STAPLE) ×6
SUT ETHILON 2 0 PS N (SUTURE) IMPLANT
SUT MNCRL AB 4-0 PS2 18 (SUTURE) ×3 IMPLANT
SUT VICRYL 0 TIES 12 18 (SUTURE) ×3 IMPLANT
SYR 10ML ECCENTRIC (SYRINGE) ×3 IMPLANT
SYR 20CC LL (SYRINGE) ×3 IMPLANT
TOWEL OR 17X26 10 PK STRL BLUE (TOWEL DISPOSABLE) ×3 IMPLANT
TOWEL OR NON WOVEN STRL DISP B (DISPOSABLE) ×3 IMPLANT
TRAY FOLEY W/METER SILVER 14FR (SET/KITS/TRAYS/PACK) IMPLANT
TRAY FOLEY W/METER SILVER 16FR (SET/KITS/TRAYS/PACK) IMPLANT
TROCAR ADV FIXATION 12X100MM (TROCAR) ×3 IMPLANT
TROCAR ADV FIXATION 5X100MM (TROCAR) ×3 IMPLANT
TROCAR BLADELESS 15MM (ENDOMECHANICALS) ×3 IMPLANT
TROCAR BLADELESS OPT 5 100 (ENDOMECHANICALS) ×3 IMPLANT
TROCAR XCEL 12X100 BLDLESS (ENDOMECHANICALS) IMPLANT
TUBING CONNECTING 10 (TUBING) ×2 IMPLANT
TUBING CONNECTING 10' (TUBING) ×1
TUBING ENDO SMARTCAP PENTAX (MISCELLANEOUS) ×3 IMPLANT
TUBING FILTER THERMOFLATOR (ELECTROSURGICAL) IMPLANT

## 2015-07-07 NOTE — Op Note (Signed)
Preoperative diagnosis: laparoscopic sleeve gastrectomy  Postoperative diagnosis: Same   Procedure: Upper endoscopy   Surgeon: Durrel Mcnee, M.D.  Anesthesia: Gen.   Indications for procedure: This patient was undergoing a laparoscopic sleeve gastrectomy.   Description of procedure: The endoscopy was placed in the mouth and into the oropharynx and under endoscopic vision it was advanced to the esophagogastric junction. The pouch was insufflated and no bleeding or bubbles were seen. The GEJ was identified at 38 cm from the teeth. No bleeding or leaks were detected. The scope was withdrawn without difficulty.   Domenick Quebedeaux, M.D. General, Bariatric, & Minimally Invasive Surgery [facility]    

## 2015-07-07 NOTE — Anesthesia Procedure Notes (Signed)
Procedure Name: Intubation Date/Time: 07/07/2015 12:30 PM Performed by: Durward Parcel A Pre-anesthesia Checklist: Patient identified, Emergency Drugs available, Suction available and Patient being monitored Patient Re-evaluated:Patient Re-evaluated prior to inductionOxygen Delivery Method: Circle system utilized Intubation Type: IV induction Ventilation: Mask ventilation without difficulty Laryngoscope Size: Mac and 4 Grade View: Grade I Tube type: Oral Number of attempts: 1 Airway Equipment and Method: Stylet Placement Confirmation: ETT inserted through vocal cords under direct vision,  positive ETCO2 and breath sounds checked- equal and bilateral Secured at: 23 cm Tube secured with: Tape Dental Injury: Teeth and Oropharynx as per pre-operative assessment

## 2015-07-07 NOTE — Anesthesia Preprocedure Evaluation (Signed)
Anesthesia Evaluation  Patient identified by MRN, date of birth, ID band Patient awake    Reviewed: Allergy & Precautions, NPO status , Patient's Chart, lab work & pertinent test results  Airway Mallampati: II   Neck ROM: full    Dental   Pulmonary neg pulmonary ROS,  breath sounds clear to auscultation        Cardiovascular hypertension, Rhythm:regular Rate:Normal     Neuro/Psych Anxiety    GI/Hepatic   Endo/Other  Morbid obesity  Renal/GU      Musculoskeletal   Abdominal   Peds  Hematology   Anesthesia Other Findings   Reproductive/Obstetrics                             Anesthesia Physical Anesthesia Plan  ASA: II  Anesthesia Plan: General   Post-op Pain Management:    Induction: Intravenous  Airway Management Planned: Oral ETT  Additional Equipment:   Intra-op Plan:   Post-operative Plan: Extubation in OR  Informed Consent: I have reviewed the patients History and Physical, chart, labs and discussed the procedure including the risks, benefits and alternatives for the proposed anesthesia with the patient or authorized representative who has indicated his/her understanding and acceptance.     Plan Discussed with: CRNA, Anesthesiologist and Surgeon  Anesthesia Plan Comments:         Anesthesia Quick Evaluation

## 2015-07-07 NOTE — Anesthesia Postprocedure Evaluation (Signed)
Anesthesia Post Note  Patient: [name]  Procedure(s) Performed: Procedure(s) (LRB): LAPAROSCOPIC GASTRIC SLEEVE RESECTION/UPPER ENDO (N/A)  Anesthesia type: General  Patient location: PACU  Post pain: Pain level controlled and Adequate analgesia  Post assessment: Post-op Vital signs reviewed, Patient's Cardiovascular Status Stable, Respiratory Function Stable, Patent Airway and Pain level controlled  Last Vitals:  Filed Vitals:   07/07/15 1606  BP: 157/90  Pulse: 69  Temp: 36.8 C  Resp: 12    Post vital signs: Reviewed and stable  Level of consciousness: awake, alert  and oriented  Complications: No apparent anesthesia complications

## 2015-07-07 NOTE — Transfer of Care (Signed)
Immediate Anesthesia Transfer of Care Note  Patient: Cody Byrd  Procedure(s) Performed: Procedure(s): LAPAROSCOPIC GASTRIC SLEEVE RESECTION/UPPER ENDO (N/A)  Patient Location: PACU  Anesthesia Type:General  Level of Consciousness: awake, oriented and patient cooperative  Airway & Oxygen Therapy: Patient Spontanous Breathing and Patient connected to nasal cannula oxygen  Post-op Assessment: Report given to RN and Post -op Vital signs reviewed and stable  Post vital signs: Reviewed and stable  Last Vitals:  Filed Vitals:   07/07/15 1428  BP:   Pulse: 98  Temp:   Resp:     Complications: No apparent anesthesia complications

## 2015-07-07 NOTE — Interval H&P Note (Signed)
History and Physical Interval Note:  07/07/2015 11:57 AM  Cody Byrd  has presented today for surgery, with the diagnosis of Morbid Obesity  The various methods of treatment have been discussed with the patient and family.   His wife is here with him.  After consideration of risks, benefits and other options for treatment, the patient has consented to  Procedure(s): LAPAROSCOPIC GASTRIC SLEEVE RESECTION/UPPER ENDO (N/A) as a surgical intervention .  The patient's history has been reviewed, patient examined, no change in status, stable for surgery.  I have reviewed the patient's chart and labs.  Questions were answered to the patient's satisfaction.     [name] H

## 2015-07-07 NOTE — Op Note (Signed)
PATIENT:   TARUN PATCHELL DOB:   09-07-1986 MRN:   [id number]  DATE OF PROCEDURE: 07/07/2015                   FACILITY:  [facility]  OPERATIVE REPORT  PREOPERATIVE DIAGNOSIS:  Morbid obesity.  POSTOPERATIVE DIAGNOSIS:  Morbid obesity (weight 305, BMI of 47.5).  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  Photos in chart.  PROCEDURE:  Laparoscopic Sleeve gastrectomy (intraoperative upper endoscopy by Dr. Sheliah Hatch)  SURGEON:  Sandria Bales. Ezzard Standing, MD  FIRST ASSISTANTGena Fray, MD  ANESTHESIA:  General endotracheal.  CRNA: Garth Bigness, CRNA  General  ESTIMATED BLOOD LOSS:  Minimal.  LOCAL ANESTHESIA:  30 cc of 1/4% Marcaine  COMPLICATIONS:  None.  INDICATION FOR SURGERY:  GEOVANNY SARTIN is a 29 y.o. AA  male who sees HUSAIN,KARRAR, MD as her primary care doctor.  She has completed our preoperative bariatric program and now comes for a laparoscopic Sleeve gastrectomy.  The indications, potential complications of surgery were explained to the patient.  Potential complications of the surgery include, but are not limited to, bleeding, infection, DVT, open surgery, and long-term nutritional consequences.  OPERATIVE NOTE:  The patient taken to room #4 at [facility] where Ms. Jean Rosenthal underwent a general endotracheal anesthetic, supervised by CRNA: Garth Bigness, CRNA.  The patient was given 2 g of cefoxitin at the beginning of the procedure.  A time-out was held and surgical checklist run.  I accessed his abdominal cavity through the left upper quadrant with a 12 mm Optiview. I did an abdominal exploration.   His omentum and bowel were unremarkable. The right and left lobes of the liver unremarkable. Gallbladder was normal. There were adhesions between the right lobe of the liver, the gall bladder, and the anterior abdominal wall.  I left these adhesions alone, in that I thought they did not contribute to the operation, and out of  concern of injuring the gall bladder or liver.  Photos were taken and placed in the chart.  His stomach was unremarkable.   I placed a total of 6 trocars. I placed a 5 mm left lateral trocar, a 5 mm left paramedian trocar (for the scope), a 12 mm right paramedian torcar (that I converted to a 15 mm to extract the stomach), a 5 mm right subcostal trocar, and 5 mm subxiphoid trocar for the liver retractor.  I started out taking down the greater curvature attachments of the stomach. I measured approximately 6 cm proximal from the pylorus and mobilized the greater curvature of the stomach with the Harmonic Scalpel. I took this dissection cranially around the greater curvature of her stomach to the angle of His and the left crus.   After I had mobilized the greater curvature of the stomach, I then passed the 36 French ViSiGi bougie which was used to suck the stomach up against the lesser curvature and placed into the antrum. During the staple firing,  I tried to give the ViSiGi a cuff at least about 1 cm. I tried to avoid narrowing the incisura. I used a total of 7 staple firings.  From antrum to the angle of His I used 2 green, 1 gold and 2 blue Eschelon 60 mm Ethicon staplers. I did not use staple line reinforcement.   At each firing of the EndoGIA stapler, I inspected the stomach, anterior wall of the stomach, and underneath to make sure there was no  compromise or impingement on to the ViSiGi bougie.   The staple line seemed linear without any corkscrewing of the stomach. Hemostasis was good. I did not use any reinforcement. She had no areas of bleeding, so I did not need additional clips.  Because I thought we had a good staple line, I then had the ViSiGi was converted to insufflate the pouch. A new stomach pouch was placed under water. There was no bubbling or leak noted.   At this point, Dr. Sheliah Hatch broke scrub and passed an upper endoscope down through the esophagus into the stomach pouch. The  stomach was tubular. There was no narrowing of the stomach pouch or angulation. We were easily able to pass the endoscope into the antrum and again put air pressure on the staple line. I irrigated the upper abdomen with saline. There was no bubbling or evidence of air leak. The mucosa looked viable. Dr. Sheliah Hatch decompressed the stomach with the endoscopy.   I converted to right paramedian trocar to a 15 trocar and extracted the stomach remnant through this intact and sent this to Pathology. I did place a single 2-0 vicryl through the fascia with an endoclose to close the wound.  I then placed 10 cc of Tisseel along the new greater curvature staple line and covered the entire staple line with the Tisseel.  I aspirated out the saline that I had irrigated because I thought the staple line looked viable and complete. There was no evidence of leak. I did not leave a drain in place.   Then, I closed the trocar sites. I closed the skin at each site with a 4-0 Monocryl, painted each wound with LiquiBand.   The patient was transported to recovery room in good condition. Sponge and needle count were correct at the end of the case.    Ovidio Kin, MD, [facility] Pager: [phone] Office phone:  [phone]

## 2015-07-08 ENCOUNTER — Inpatient Hospital Stay (HOSPITAL_COMMUNITY): Payer: BLUE CROSS/BLUE SHIELD

## 2015-07-08 ENCOUNTER — Encounter (HOSPITAL_COMMUNITY): Payer: Self-pay | Admitting: Surgery

## 2015-07-08 LAB — CBC WITH DIFFERENTIAL/PLATELET
BASOS PCT: 0 % (ref 0–1)
Basophils Absolute: 0 10*3/uL (ref 0.0–0.1)
Eosinophils Absolute: 0 10*3/uL (ref 0.0–0.7)
Eosinophils Relative: 0 % (ref 0–5)
HEMATOCRIT: 42.9 % (ref 39.0–52.0)
HEMOGLOBIN: 14.5 g/dL (ref 13.0–17.0)
LYMPHS ABS: 0.9 10*3/uL (ref 0.7–4.0)
Lymphocytes Relative: 10 % — ABNORMAL LOW (ref 12–46)
MCH: 25.8 pg — ABNORMAL LOW (ref 26.0–34.0)
MCHC: 33.8 g/dL (ref 30.0–36.0)
MCV: 76.2 fL — ABNORMAL LOW (ref 78.0–100.0)
MONOS PCT: 8 % (ref 3–12)
Monocytes Absolute: 0.7 10*3/uL (ref 0.1–1.0)
NEUTROS ABS: 7.6 10*3/uL (ref 1.7–7.7)
NEUTROS PCT: 82 % — AB (ref 43–77)
Platelets: 216 10*3/uL (ref 150–400)
RBC: 5.63 MIL/uL (ref 4.22–5.81)
RDW: 14.6 % (ref 11.5–15.5)
WBC: 9.1 10*3/uL (ref 4.0–10.5)

## 2015-07-08 LAB — HEMOGLOBIN AND HEMATOCRIT, BLOOD
HEMATOCRIT: 42.5 % (ref 39.0–52.0)
Hemoglobin: 14.2 g/dL (ref 13.0–17.0)

## 2015-07-08 MED ORDER — IOHEXOL 300 MG/ML  SOLN
50.0000 mL | Freq: Once | INTRAMUSCULAR | Status: DC | PRN
  Administered 2015-07-08: 40 mL via ORAL
  Filled 2015-07-08: qty 50

## 2015-07-08 NOTE — Progress Notes (Signed)
Patient alert and oriented, Post op day 1.  Provided support and encouragement.  Encouraged pulmonary toilet, ambulation and small sips of liquids when swallow study returned satisfactorily.  All questions answered.  Will continue to monitor. 

## 2015-07-08 NOTE — H&P (Signed)
General Surgery Note  LOS: 1 day  POD -  1 Day Post-Op  Assessment/Plan: 1.  LAPAROSCOPIC GASTRIC SLEEVE RESECTION, UPPER ENDO - 8/22/106 - D. Ireoluwa Gorsline  For swallow today  2. HTN - he says that he treats it with mustard. 3. Musculoskelatal - he has back and knee pain.  4. Has claustrophobia 5.  DVT prophylaxis - SQ Heparin   Active Problems:   Morbid obesity  Subjective:  Sore and mildly nauseated.  Wife sleeping in room. Objective:   Filed Vitals:   07/08/15 0530  BP: 143/84  Pulse: 86  Temp: 99.7 F (37.6 C)  Resp: 18     Intake/Output from previous day:  08/22 0701 - 08/23 0700 In: 3812.5 [I.V.:3762.5; IV Piggyback:50] Out: 2095 [Urine:2075; Blood:20]  Intake/Output this shift:      Physical Exam:   General: Obese AA M who is alert and oriented.    HEENT: Normal. Pupils equal. .   Lungs: Clear.  IS - 1,500 cc   Abdomen: Soft. Has a few BS.   Wound: Clean    Lab Results:    Recent Labs  07/07/15 1656 07/08/15 0430  WBC  --  9.1  HGB 14.6 14.5  HCT 43.2 42.9  PLT  --  216    BMET   Recent Labs  07/07/15 1656  CREATININE 1.32*    PT/INR  No results for input(s): LABPROT, INR in the last 72 hours.  ABG  No results for input(s): PHART, HCO3 in the last 72 hours.  Invalid input(s): PCO2, PO2   Studies/Results:  No results found.   Anti-infectives:   Anti-infectives    Start     Dose/Rate Route Frequency Ordered Stop   07/07/15 1045  cefOXitin (MEFOXIN) 2 g in dextrose 5 % 50 mL IVPB     2 g 100 mL/hr over 30 Minutes Intravenous On call to O.R. 07/07/15 1045 07/07/15 1219      Ovidio Kin, MD, FACS Pager: [phone] [place] [facility] Office: [phone] 07/08/2015

## 2015-07-08 NOTE — Care Management Note (Signed)
Case Management Note  Patient Details  Name: RAYLON LAMSON MRN: [id number] Date of Birth: 17-Mar-1986  Subjective/Objective:                Admitted s/p sleeve gastrectomy    Action/Plan: Discharge planning  Expected Discharge Date:                  Expected Discharge Plan:  Home/Self Care  In-House Referral:  NA  Discharge planning Services  CM Consult  Post Acute Care Choice:  NA Choice offered to:  NA  DME Arranged:  N/A DME Agency:  NA  HH Arranged:  NA HH Agency:  NA  Status of Service:  Completed, signed off  Medicare Important Message Given:    Date Medicare IM Given:    Medicare IM give by:    Date Additional Medicare IM Given:    Additional Medicare Important Message give by:     If discussed at Long Length of Stay Meetings, dates discussed:    Additional Comments:  Alexis Goodell, RN 07/08/2015, 10:41 AM

## 2015-07-08 NOTE — Plan of Care (Signed)
Problem: Food- and Nutrition-Related Knowledge Deficit (NB-1.1) Goal: Nutrition education Formal process to instruct or train a patient/client in a skill or to impart knowledge to help patients/clients voluntarily manage or modify food choices and eating behavior to maintain or improve health. Outcome: Completed/Met Date Met:  07/08/15 Nutrition Education Note  Received consult for diet education per DROP protocol.   Discussed 2 week post op diet with pt. Emphasized that liquids must be non carbonated, non caffeinated, and sugar free. Fluid goals discussed. Pt to follow up with outpatient bariatric RD for further diet progression after 2 weeks. Multivitamins and minerals also reviewed. Teach back method used, pt expressed understanding, expect good compliance.   Diet: First 2 Weeks  You will see the nutritionist about two (2) weeks after your surgery. The nutritionist will increase the types of foods you can eat if you are handling liquids well:  If you have severe vomiting or nausea and cannot handle clear liquids lasting longer than 1 day, call your surgeon  Protein Shake  Drink at least 2 ounces of shake 5-6 times per day  Each serving of protein shakes (usually 8 - 12 ounces) should have a minimum of:  15 grams of protein  And no more than 5 grams of carbohydrate  Goal for protein each day:  Men = 80 grams per day  Women = 60 grams per day  Protein powder may be added to fluids such as non-fat milk or Lactaid milk or Soy milk (limit to 35 grams added protein powder per serving)   Hydration  Slowly increase the amount of water and other clear liquids as tolerated (See Acceptable Fluids)  Slowly increase the amount of protein shake as tolerated  Sip fluids slowly and throughout the day  May use sugar substitutes in small amounts (no more than 6 - 8 packets per day; i.e. Splenda)   Fluid Goal  The first goal is to drink at least 8 ounces of protein shake/drink per day (or as directed  by the nutritionist); some examples of protein shakes are Syntrax Nectar, Adkins Advantage, EAS Edge HP, and Unjury. See handout from pre-op Bariatric Education Class:  Slowly increase the amount of protein shake you drink as tolerated  You may find it easier to slowly sip shakes throughout the day  It is important to get your proteins in first  Your fluid goal is to drink 64 - 100 ounces of fluid daily  It may take a few weeks to build up to this  32 oz (or more) should be clear liquids  And  32 oz (or more) should be full liquids (see below for examples)  Liquids should not contain sugar, caffeine, or carbonation   Clear Liquids:  Water or Sugar-free flavored water (i.e. Fruit H2O, Propel)  Decaffeinated coffee or tea (sugar-free)  Crystal Lite, Wyler's Lite, Minute Maid Lite  Sugar-free Jell-O  Bouillon or broth  Sugar-free Popsicle: *Less than 20 calories each; Limit 1 per day   Full Liquids:  Protein Shakes/Drinks + 2 choices per day of other full liquids  Full liquids must be:  No More Than 12 grams of Carbs per serving  No More Than 3 grams of Fat per serving  Strained low-fat cream soup  Non-Fat milk  Fat-free Lactaid Milk  Sugar-free yogurt (Dannon Lite & Fit, Greek yogurt)     Lindsey Baker, MS, RD, LDN Pager: [phone] After Hours Pager: [phone]        

## 2015-07-09 LAB — CBC WITH DIFFERENTIAL/PLATELET
BASOS ABS: 0 10*3/uL (ref 0.0–0.1)
Basophils Relative: 0 % (ref 0–1)
Eosinophils Absolute: 0 10*3/uL (ref 0.0–0.7)
Eosinophils Relative: 0 % (ref 0–5)
HEMATOCRIT: 43.5 % (ref 39.0–52.0)
HEMOGLOBIN: 14.5 g/dL (ref 13.0–17.0)
LYMPHS PCT: 25 % (ref 12–46)
Lymphs Abs: 1.9 10*3/uL (ref 0.7–4.0)
MCH: 25.8 pg — ABNORMAL LOW (ref 26.0–34.0)
MCHC: 33.3 g/dL (ref 30.0–36.0)
MCV: 77.4 fL — AB (ref 78.0–100.0)
MONO ABS: 0.7 10*3/uL (ref 0.1–1.0)
Monocytes Relative: 9 % (ref 3–12)
NEUTROS ABS: 5.1 10*3/uL (ref 1.7–7.7)
NEUTROS PCT: 66 % (ref 43–77)
Platelets: 178 10*3/uL (ref 150–400)
RBC: 5.62 MIL/uL (ref 4.22–5.81)
RDW: 14.9 % (ref 11.5–15.5)
WBC: 7.6 10*3/uL (ref 4.0–10.5)

## 2015-07-09 NOTE — Discharge Instructions (Signed)
[facility] - DISCHARGE INSTRUCTIONS TO PATIENT  Return to work on:  08/04/2015  Activity:  Driving - May drive in 3 or 4 days, if doing well and off pain meds   Lifting - No lifting more than 15 pounds for 1 week, then no limit  Wound Care:   May shower  Diet:  Post op sleeve diet.        Drink lots of water  Follow up appointment:  Call Dr. Allene Pyo office [facility]) at [phone] for an appointment in 2 to 3 weeks  Medications and dosages:  Resume your home medications.  You have a prescription for:  Oxycodone elixir  Call Dr. Ezzard Standing or his office  [phone]) if you have:  Temperature greater than 100.4,  Persistent nausea and vomiting,  Severe uncontrolled pain,  Redness, tenderness, or signs of infection (pain, swelling, redness, odor or green/yellow discharge around the site),  Difficulty breathing, headache or visual disturbances,  Any other questions or concerns you may have after discharge.  In an emergency, call 911 or go to an Emergency Department at a nearby hospital.       GASTRIC BYPASS/SLEEVE  Home Care Instructions   These instructions are to help you care for yourself when you go home.  Call: If you have any problems.  Call [phone] and ask for the surgeon on call  If you need immediate assistance come to the ER at [facility]. Tell the ER staff you are a new post-op gastric bypass or gastric sleeve patient  Signs and symptoms to report:  Severe  vomiting or nausea o If you cannot handle clear liquids for longer than 1 day, call your surgeon  Abdominal pain which does not get better after taking your pain medication  Fever greater than 100.4  F and chills  Heart rate over 100 beats a minute  Trouble breathing  Chest pain  Redness,  swelling, drainage, or foul odor at incision (surgical) sites  If your incisions open or pull apart  Swelling or pain in calf (lower leg)  Diarrhea (Loose bowel movements  that happen often), frequent watery, uncontrolled bowel movements  Constipation, (no bowel movements for 3 days) if this happens: o Take Milk of Magnesia, 2 tablespoons by mouth, 3 times a day for 2 days if needed o Stop taking Milk of Magnesia once you have had a bowel movement o Call your doctor if constipation continues Or o Take Miralax  (instead of Milk of Magnesia) following the label instructions o Stop taking Miralax once you have had a bowel movement o Call your doctor if constipation continues  Anything you think is abnormal for you   Normal side effects after surgery:  Unable to sleep at night or unable to concentrate  Irritability  Being tearful (crying) or depressed  These are common complaints, possibly related to your anesthesia, stress of surgery, and change in lifestyle, that usually go away a few weeks after surgery. If these feelings continue, call your medical doctor.  Wound Care: You may have surgical glue, steri-strips, or staples over your incisions after surgery  Surgical glue: Looks like clear film over your incisions and will wear off a little at a time  Steri-strips: Adhesive strips of tape over your incisions. You may notice a yellowish color on skin under the steri-strips. This is used to make the steri-strips stick better. Do not pull the steri-strips off - let them fall off  Staples: Staples may be removed before you  leave the hospital o If you go home with staples, call [facility] for an appointment with your surgeons nurse to have staples removed 10 days after surgery, [phone]  Showering: You may shower two (2) days after your surgery unless your surgeon tells you differently o Wash gently around incisions with warm soapy water, rinse well, and gently pat dry o If you have a drain (tube from your incision), you may need someone to hold this while you shower o No tub baths until staples are removed and incisions are healed    Medications:  Medications should be liquid or crushed if larger than the size of a dime  Extended release pills (medication that releases a little bit at a time through the  day) should not be crushed  Depending on the size and number of medications you take, you may need to space (take a few throughout the day)/change the time you take your medications so that you do not over-fill your pouch (smaller stomach)  Make sure you follow-up with you primary care physician to make medication changes needed during rapid weight loss and life -style changes  If you have diabetes, follow up with your doctor that orders your diabetes medication(s) within one week after surgery and check your blood sugar regularly   Do not drive while taking narcotics (pain medications)   Do not take acetaminophen (Tylenol) and Roxicet or Lortab Elixir at the same time since these pain medications contain acetaminophen   Diet:  First 2 Weeks You will see the nutritionist about two (2) weeks after your surgery. The nutritionist will increase the types of foods you can eat if you are handling liquids well:  If you have severe vomiting or nausea and cannot handle clear liquids lasting longer than 1 day call your surgeon Protein Shake  Drink at least 2 ounces of shake 5-6 times per day  Each serving of protein shakes (usually 8-12 ounces) should have a minimum of: o 15 grams of protein o And no more than 5 grams of carbohydrate  Goal for protein each day: o Men = 80 grams per day o Women = 60 grams per day     Protein powder may be added to fluids such as non-fat milk or Lactaid milk or Soy milk (limit to 35 grams added protein powder per serving)  Hydration  Slowly increase the amount of water and other clear liquids as tolerated (See Acceptable Fluids)  Slowly increase the amount of protein shake as tolerated  Sip fluids slowly and throughout the day  May use sugar substitutes in small amounts (no more  than 6-8 packets per day; i.e. Splenda)  Fluid Goal  The first goal is to drink at least 8 ounces of protein shake/drink per day (or as directed by the nutritionist); some examples of protein shakes are [employer], Dillard's, EAS Edge HP, and Unjury. - See handout from pre-op Bariatric Education Class: o Slowly increase the amount of protein shake you drink as tolerated o You may find it easier to slowly sip shakes throughout the day o It is important to get your proteins in first  Your fluid goal is to drink 64-100 ounces of fluid daily o It may take a few weeks to build up to this   32 oz. (or more) should be clear liquids And  32 oz. (or more) should be full liquids (see below for examples)  Liquids should not contain sugar, caffeine, or carbonation  Clear Liquids:  Water of Sugar-free flavored water (i.e. Fruit HO, Propel)  Decaffeinated coffee or tea (sugar-free)  Crystal lite, Wylers Lite, Minute Maid Lite  Sugar-free Jell-O  Bouillon or broth  Sugar-free Popsicle:    - Less than 20 calories each; Limit 1 per day  Full Liquids:                   Protein Shakes/Drinks + 2 choices per day of other full liquids  Full liquids must be: o No More Than 12 grams of Carbs per serving o No More Than 3 grams of Fat per serving  Strained low-fat cream soup  Non-Fat milk  Fat-free Lactaid Milk  Sugar-free yogurt (Dannon Lite & Fit, Greek yogurt)    Vitamins and Minerals  Start 1 day after surgery unless otherwise directed by your surgeon  2 Chewable Multivitamin / Multimineral Supplement with iron (i.e. Centrum for Adults)  Vitamin B-12, 350-500 micrograms sub-lingual (place tablet under the tongue) each day  Chewable Calcium Citrate with Vitamin D-3 (Example: 3 Chewable Calcium  Plus 600 with Vitamin D-3) o Take 500 mg three (3) times a day for a total of 1500 mg each day o Do not take all 3 doses of calcium at one time as it may cause constipation,  and you can only absorb 500 mg at a time o Do not mix multivitamins containing iron with calcium supplements;  take 2 hours apart o Do not substitute Tums (calcium carbonate) for your calcium  Menstruating women and those at risk for anemia ( a blood disease that causes weakness) may need extra iron o Talk to your doctor to see if you need more iron  If you need extra iron: Total daily Iron recommendation (including Vitamins) is 50 to 100 mg Iron/day  Do not stop taking or change any vitamins or minerals until you talk to your nutritionist or surgeon  Your nutritionist and/or surgeon must approve all vitamin and mineral supplements   Activity and Exercise: It is important to continue walking at home. Limit your physical activity as instructed by your doctor. During this time, use these guidelines:  Do not lift anything greater than ten  (10) pounds for at least two (2) weeks  Do not go back to work or drive until Designer, industrial/product says you can  You may have sex when you feel comfortable o It is VERY important for male patients to use a reliable birth control method; fertility often increase after surgery o Do not get pregnant for at least 18 months  Start exercising as soon as your doctor tells you that you can o Make sure your doctor approves any physical activity  Start with a simple walking program  Walk 5-15 minutes each day, 7 days per week  Slowly increase until you are walking 30-45 minutes per day  Consider joining our BELT program. [phone] or email [email or web address]   Special Instructions Things to remember:  Free counseling is available for you and your family through collaboration between [facility] and [place]. Please call [phone] and leave a message  Use your CPAP when sleeping if this applies to you  Consider buying a medical alert bracelet that says you had lap-band surgery     You will likely have your first fill (fluid added to your band) 6 - 8 weeks  after surgery  [facility] has a free Bariatric Surgery Support Group that meets monthly, the 3rd Thursday, 6pm. Calvert Cantor. You can see  classes online at [email or web address]  It is very important to keep all follow up appointments with your surgeon, nutritionist, primary care physician, and behavioral health practitioner o After the first year, please follow up with your bariatric surgeon and nutritionist at least once a year in order to maintain best weight loss results                    [facility]:  [phone]               [facility]: [phone]               Bariatric Nurse Coordinator: [phone]  Gastric Bypass/Sleeve Home Care Instructions  Rev. 12/2012                                                         Reviewed and Endorsed                                                    by [facility] Patient Education Committee, Jan, 2014

## 2015-07-09 NOTE — Progress Notes (Signed)
Discharge instructions discussed with pt able to answer questions about when to call MD and home medications.  Iv discontinued. Am assessment unchanged. Voiding qs. Tolerating oral pain medicine and protein shakes. Ambulating without difficulty.

## 2015-07-09 NOTE — Discharge Summary (Signed)
Physician Discharge Summary  Patient ID:  Cody Byrd  MRN: [id number]  DOB/AGE: 1986-09-19 29 y.o.  Admit date: 07/07/2015 Discharge date: 07/09/2015  Discharge Diagnoses:  1.  Morbid Obesity  Weight - 305, BMI - 47.5 2. HTN - he says that he treats it with mustard. 3. Musculoskelatal - he has back and knee pain. He was seen in [employer] before he left service, but has not seen someone locally. 4. Has claustrophobia  5. I am seeing his wife, Avi Kerschner, for weight loss surgery.   Active Problems:   Morbid obesity  Operation: Procedure(s): LAPAROSCOPIC GASTRIC SLEEVE RESECTION/UPPER ENDO on 07/07/2015 - D. Ezzard Standing  Discharged Condition: good  Hospital Course: Cody Byrd is an 29 y.o. male whose primary care physician is Georgann Housekeeper, MD and who was admitted 07/07/2015 with a chief complaint of morbid obesity.  He completed our bariatric pre op program and is ready for surgery.   He was brought to the operating room on 07/07/2015 and underwent  LAPAROSCOPIC GASTRIC SLEEVE RESECTION/UPPER ENDO.   Post op his swallow looked good.  He is now 2 days post op and taking protein shakes.  He is ready to go home.  His wife is in the room with him. I filled out paper work for returning to work 08/04/2015.  The discharge instructions were reviewed with the patient.  Consults: None  Significant Diagnostic Studies: Results for orders placed or performed during the hospital encounter of 07/07/15  Hemoglobin and hematocrit, blood  Result Value Ref Range   Hemoglobin 14.6 13.0 - 17.0 g/dL   HCT 40.9 81.1 - 91.4 %  Creatinine, serum  Result Value Ref Range   Creatinine, Ser 1.32 (H) 0.61 - 1.24 mg/dL   GFR calc non Af Amer >60 >60 mL/min   GFR calc Af Amer >60 >60 mL/min  CBC WITH DIFFERENTIAL  Result Value Ref Range   WBC 9.1 4.0 - 10.5 K/uL   RBC 5.63 4.22 - 5.81 MIL/uL   Hemoglobin 14.5 13.0 - 17.0 g/dL   HCT 78.2 95.6 - 21.3 %   MCV 76.2 (L) 78.0 - 100.0 fL    MCH 25.8 (L) 26.0 - 34.0 pg   MCHC 33.8 30.0 - 36.0 g/dL   RDW 08.6 57.8 - 46.9 %   Platelets 216 150 - 400 K/uL   Neutrophils Relative % 82 (H) 43 - 77 %   Neutro Abs 7.6 1.7 - 7.7 K/uL   Lymphocytes Relative 10 (L) 12 - 46 %   Lymphs Abs 0.9 0.7 - 4.0 K/uL   Monocytes Relative 8 3 - 12 %   Monocytes Absolute 0.7 0.1 - 1.0 K/uL   Eosinophils Relative 0 0 - 5 %   Eosinophils Absolute 0.0 0.0 - 0.7 K/uL   Basophils Relative 0 0 - 1 %   Basophils Absolute 0.0 0.0 - 0.1 K/uL  Hemoglobin and hematocrit, blood  Result Value Ref Range   Hemoglobin 14.2 13.0 - 17.0 g/dL   HCT 62.9 52.8 - 41.3 %  CBC with Differential  Result Value Ref Range   WBC 7.6 4.0 - 10.5 K/uL   RBC 5.62 4.22 - 5.81 MIL/uL   Hemoglobin 14.5 13.0 - 17.0 g/dL   HCT 24.4 01.0 - 27.2 %   MCV 77.4 (L) 78.0 - 100.0 fL   MCH 25.8 (L) 26.0 - 34.0 pg   MCHC 33.3 30.0 - 36.0 g/dL   RDW 53.6 64.4 - 03.4 %   Platelets 178 150 -  400 K/uL   Neutrophils Relative % 66 43 - 77 %   Neutro Abs 5.1 1.7 - 7.7 K/uL   Lymphocytes Relative 25 12 - 46 %   Lymphs Abs 1.9 0.7 - 4.0 K/uL   Monocytes Relative 9 3 - 12 %   Monocytes Absolute 0.7 0.1 - 1.0 K/uL   Eosinophils Relative 0 0 - 5 %   Eosinophils Absolute 0.0 0.0 - 0.7 K/uL   Basophils Relative 0 0 - 1 %   Basophils Absolute 0.0 0.0 - 0.1 K/uL    Dg Ugi W/water Sol Cm  07/08/2015   CLINICAL DATA:  Patient status post gastric sleeve bariatric surgery.  EXAM: UPPER GI SERIES WITH KUB  TECHNIQUE: After obtaining a scout radiograph a routine upper GI series was performed using water-soluble contrast  FLUOROSCOPY TIME:  Radiation Exposure Index (as provided by the fluoroscopic device):  If the device does not provide the exposure index:  Fluoroscopy Time (in minutes and seconds):  1 minutes 13 seconds  Number of Acquired Images:  10 all  COMPARISON:  Upper GI 07/10/2015  FINDINGS: Water-soluble contrast flows readily through the gastroesophageal junction. Gastric sleeve bypass  anatomy noted. Contrast flows along the body of stomach into the first portion the duodenum without evidence obstruction or leak.  IMPRESSION: No evidence of obstruction or leak following gastric sleeve bariatric surgery.   Electronically Signed   By: Genevive Bi M.D.   On: 07/08/2015 09:43    Discharge Exam:  Filed Vitals:   07/09/15 0542  BP: 137/79  Pulse: 72  Temp: 99.3 F (37.4 C)  Resp: 18    General: Obese AA M who is alert and generally healthy appearing.  Lungs: Clear to auscultation and symmetric breath sounds. Heart:  RRR. No murmur or rub. Abdomen: Soft. No mass.  Normal bowel sounds.  Incisions look good.  Discharge Medications:     Medication List    TAKE these medications        valsartan 80 MG tablet  Commonly known as:  DIOVAN  Take 80 mg by mouth daily. @ 1 pm        Disposition: 01-Home or Self Care      Discharge Instructions    Ambulate hourly while awake    Complete by:  As directed      Call MD for:  difficulty breathing, headache or visual disturbances    Complete by:  As directed      Call MD for:  persistant dizziness or light-headedness    Complete by:  As directed      Call MD for:  persistant nausea and vomiting    Complete by:  As directed      Call MD for:  redness, tenderness, or signs of infection (pain, swelling, redness, odor or green/yellow discharge around incision site)    Complete by:  As directed      Call MD for:  severe uncontrolled pain    Complete by:  As directed      Call MD for:  temperature >101 F    Complete by:  As directed      Diet bariatric full liquid    Complete by:  As directed      Incentive spirometry    Complete by:  As directed   Perform hourly while awake          Return to work on:  08/04/2015  Activity:  Driving - May drive in 3 or 4 days, if  doing well and off pain meds   Lifting - No lifting more than 15 pounds for 1 week, then no limit  Wound Care:   May shower  Diet:  Post op  sleeve diet.        Drink lots of water  Follow up appointment:  Call Dr. Allene Pyo office [facility]) at [phone] for an appointment in 2 to 3 weeks  Medications and dosages:  Resume your home medications.  You have a prescription for:  Oxycodone elixir  Signed: Ovidio Kin, M.D., [facility] Office:  [phone]  07/09/2015, 8:20 AM   \

## 2015-07-09 NOTE — Progress Notes (Signed)
Patient alert and oriented, pain is controlled. Patient is tolerating fluids,  advanced to protein shake today, patient tolerated well.  Reviewed Gastric sleeve discharge instructions with patient and patient is able to articulate understanding.  Provided information on BELT program, Support Group and [facility] outpatient pharmacy. All questions answered, will continue to monitor.  

## 2015-07-14 ENCOUNTER — Telehealth (HOSPITAL_COMMUNITY): Payer: Self-pay

## 2015-07-14 NOTE — Telephone Encounter (Signed)

## 2015-07-22 ENCOUNTER — Encounter: Payer: BLUE CROSS/BLUE SHIELD | Attending: Surgery

## 2015-07-22 DIAGNOSIS — Z713 Dietary counseling and surveillance: Secondary | ICD-10-CM | POA: Insufficient documentation

## 2015-07-22 DIAGNOSIS — Z6841 Body Mass Index (BMI) 40.0 and over, adult: Secondary | ICD-10-CM | POA: Insufficient documentation

## 2015-07-23 NOTE — Progress Notes (Signed)
Bariatric Class:  Appt start time: 1530 end time:  1630.  2 Week Post-Operative Nutrition Class  Patient was seen on 07/22/15 for Post-Operative [facility]   Surgery date: 07/07/2015 Surgery type: Sleeve gastrectomy Start weight at [facility] 309 lbs on 12/17/14 Weight today:268.0 lbs  Weight change: 23 lbs  TANITA  BODY COMP RESULTS  06/16/15 07/23/15   BMI (kg/m^2) 44.2 40.7   Fat Mass (lbs) 153 119.0   Fat Free Mass (lbs) 138 149.0   Total Body Water (lbs) 101 109.0    The following the learning objectives were met by the patient during this course:  Identifies Phase 3A (Soft, High Proteins) Dietary Goals and will begin from 2 weeks post-operatively to 2 months post-operatively  Identifies appropriate sources of fluids and proteins   States protein recommendations and appropriate sources post-operatively  Identifies the need for appropriate texture modifications, mastication, and bite sizes when consuming solids  Identifies appropriate multivitamin and calcium sources post-operatively  Describes the need for physical activity post-operatively and will follow MD recommendations  States when to call healthcare provider regarding medication questions or post-operative complications  Handouts given during class include:  Phase 3A: Soft, High Protein Diet Handout  Follow-Up Plan: Patient will follow-up at [facility] in 6 weeks for 2 month post-op nutrition visit for diet advancement per MD.

## 2015-09-03 ENCOUNTER — Encounter: Payer: BLUE CROSS/BLUE SHIELD | Attending: Surgery | Admitting: Dietician

## 2015-09-03 ENCOUNTER — Encounter: Payer: Self-pay | Admitting: Dietician

## 2015-09-03 DIAGNOSIS — Z713 Dietary counseling and surveillance: Secondary | ICD-10-CM | POA: Diagnosis not present

## 2015-09-03 DIAGNOSIS — Z6841 Body Mass Index (BMI) 40.0 and over, adult: Secondary | ICD-10-CM | POA: Diagnosis not present

## 2015-09-03 NOTE — Patient Instructions (Addendum)
Goals:  Follow Phase 3B: High Protein + Non-Starchy Vegetables  Eat 3-6 small meals/snacks, every 3-5 hrs  Increase lean protein foods to meet 80g goal  Increase fluid intake to 64oz +  Avoid drinking 15 minutes before, during and 30 minutes after eating  Aim for >30 min of physical activity daily  TANITA  BODY COMP RESULTS  06/16/15 07/23/15 09/03/15   BMI (kg/m^2) 44.2 40.7 37.7   Fat Mass (lbs) 153 119.0 95.5   Fat Free Mass (lbs) 138 149.0 152.5   Total Body Water (lbs) 101 109.0 111.5

## 2015-09-03 NOTE — Progress Notes (Signed)
  Follow-up visit:  8 Weeks Post-Operative Sleeve Gastrectomy Surgery  Medical Nutrition Therapy:  Appt start time: 1145 end time:  1210  Primary concerns today: Post-operative Bariatric Surgery Nutrition Management. Cody Byrd returns having lost another 20 pounds. He reports that he cannot tolerate much chicken bet beef is well tolerated (3-5 oz at a time). Cody Byrd is drinking 2-4 protein shakes daily. He reports that he would prefer that his diet continue to consist of protein chips, Quest bars, and protein shakes.  Surgery date: 07/07/2015 Surgery type: Sleeve gastrectomy [facility]: 309 lbs on 12/17/14 Weight today: 248 lbs Weight change: 20 lbs Total weight lost: 61 lbs   TANITA  BODY COMP RESULTS  06/16/15 07/23/15 09/03/15   BMI (kg/m^2) 44.2 40.7 37.7   Fat Mass (lbs) 153 119.0 95.5   Fat Free Mass (lbs) 138 149.0 152.5   Total Body Water (lbs) 101 109.0 111.5    Preferred Learning Style:   No preference indicated   Learning Readiness:   Ready  24-hr recall:  2-4 protein shakes per day  B (AM): protein shake (Premier or Nectar) OR egg and Malaw[place]urkey bacon or livermush (12-30g) Snk (AM): yogurt or jello   L (PM): shake or salad with meatballs (~20g) Snk (PM):   D (PM): meat and cheese OR protein shake (30g) Snk (PM):   Fluid intake: at least 64 oz per patient (Powerade Zero, propel, sugar free vitamin water, hot tea) Estimated total protein intake: 90+ grams per day  Medications: no longer taking blood pressure medication Supplementation: taking, sometimes forgets Calcium  Using straws: no, has tried straws and no gas pain reported Drinking while eating: no Hair loss: none Carbonated beverages: none N/V/D/C: constipation Dumping syndrome: none  Recent physical activity:  2-3 days of jogging (15 min) + weight lifting  Progress Towards Goal(s):  In progress.  Handouts given during visit include:  Phase 3B lean protein + non starchy  vegetables   Nutritional Diagnosis:  [place]-3.3 Overweight/obesity related to past poor dietary habits and physical inactivity as evidenced by patient w/ recent sleeve gastrectomy surgery following dietary guidelines for continued weight loss.     Intervention:  Nutrition counseling provided. Encouraged real foods as nutrients are better absorbed when they come from whole foods rather than shakes and protein bars. Asked patient to consider whether continuing to eat meal replacements will be realistic long-term.   Teaching Method Utilized:  Visual Auditory Hands on  Barriers to learning/adherence to lifestyle change: none  Demonstrated degree of understanding via:  Teach Back   Monitoring/Evaluation:  Dietary intake, exercise, and body weight. Follow up prn.

## 2016-08-30 IMAGING — RF DG UGI W/ KUB
15 of 24 series · 15 of 24 positions shown · non-contrast
Comparison: None.

CLINICAL DATA: Bariatric screening.

EXAM:
UPPER GI SERIES WITH KUB
TECHNIQUE: After obtaining a scout radiograph a routine upper GI series was
performed using thin barium liquid
FLUOROSCOPY TIME:  Fluoroscopy Time (in minutes and seconds): 1 min
6 seconds
Number of Acquired Images:  22

[Series 1: run · 1 of 1 slices shown (1 of 14)]
[im 1/1]
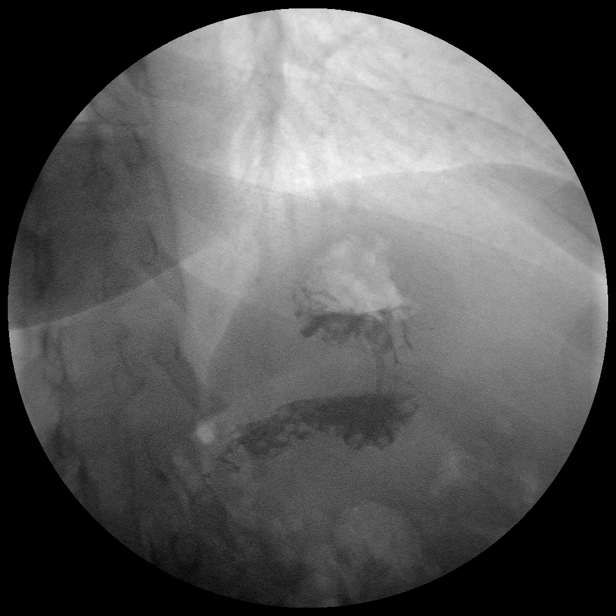

[Series 2: abdomen kub · 0.14mm/px · 1 of 1 slices shown]
[im 1/1]
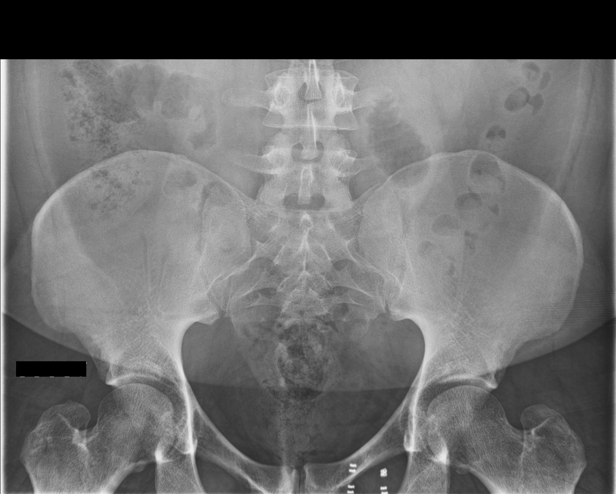

[Series 3: run · 1 of 1 slices shown (2 of 14)]
[im 1/1]
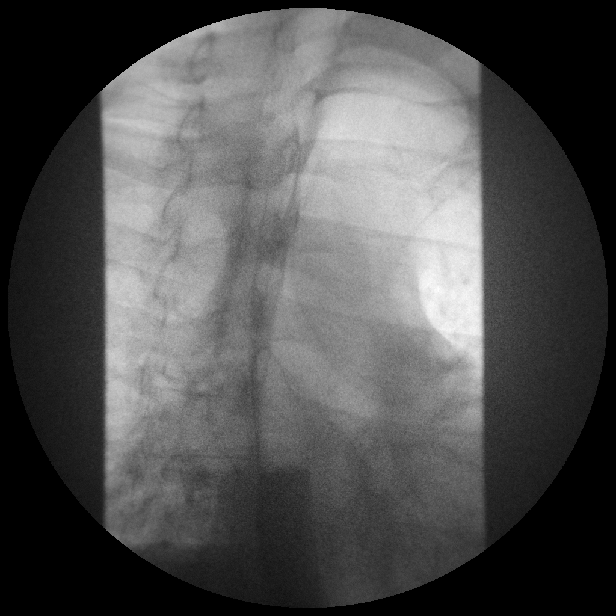

[Series 4: run · 1 of 1 slices shown (3 of 14)]
[im 1/1]
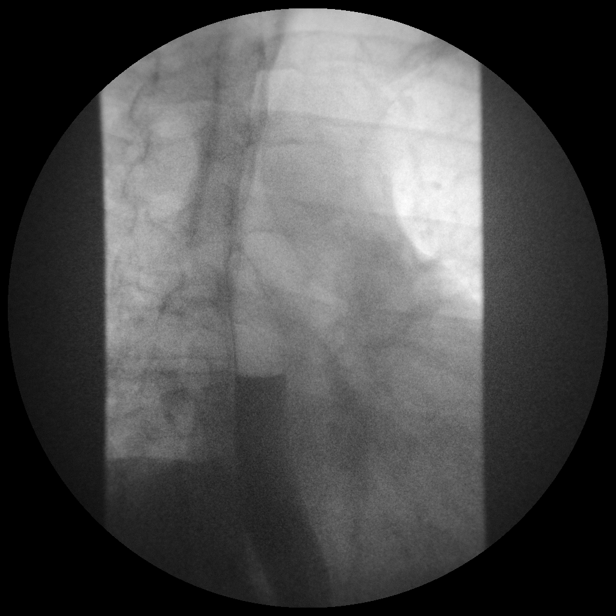

[Series 6: run · 1 of 1 slices shown (4 of 14)]
[im 1/1]
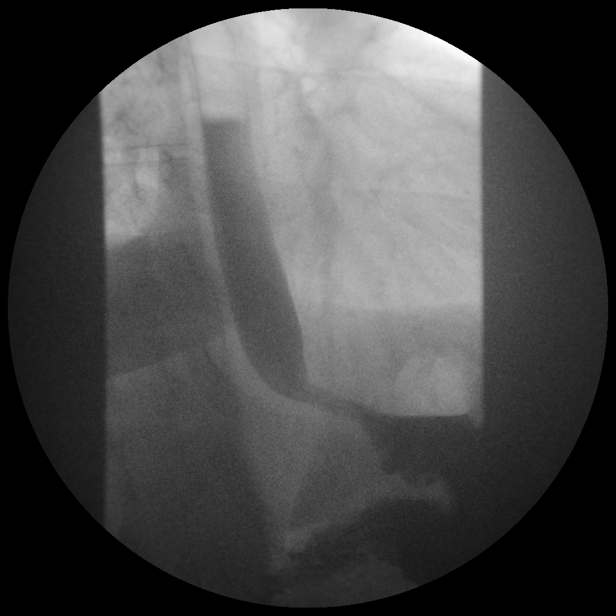

[Series 7: run · 1 of 1 slices shown (5 of 14)]
[im 1/1]
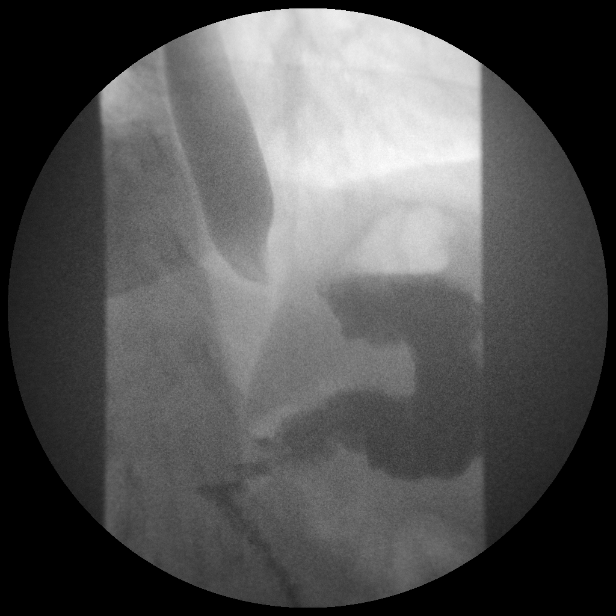

[Series 9: run · 1 of 1 slices shown (6 of 14)]
[im 1/1]
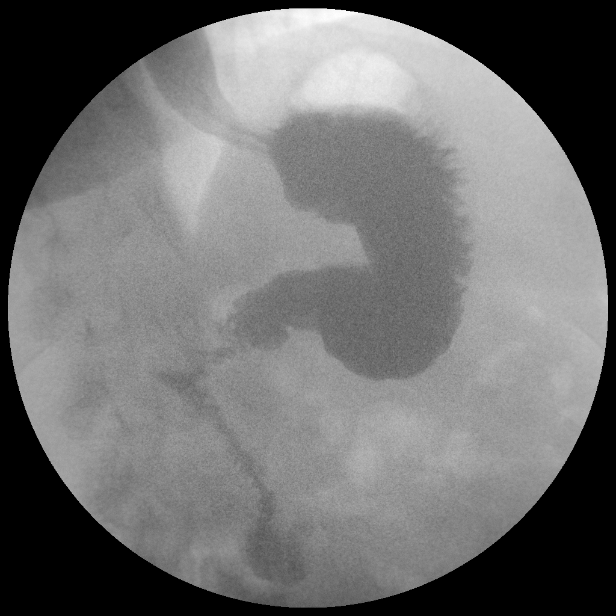

[Series 11: run · 1 of 1 slices shown (7 of 14)]
[im 1/1]
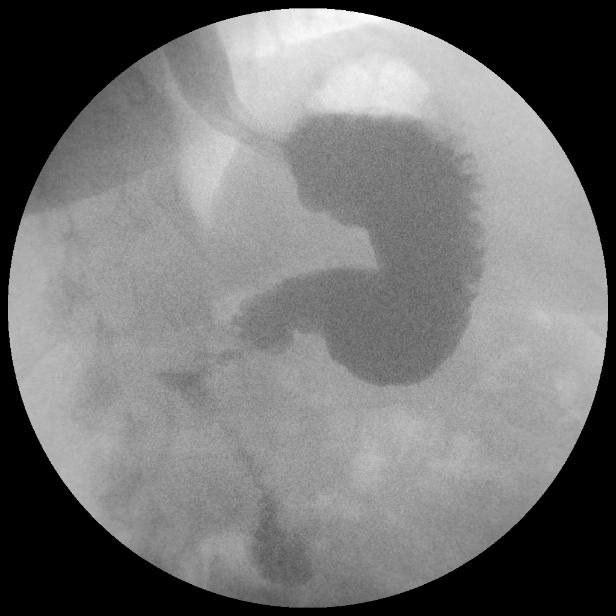

[Series 12: run · 1 of 1 slices shown (8 of 14)]
[im 1/1]
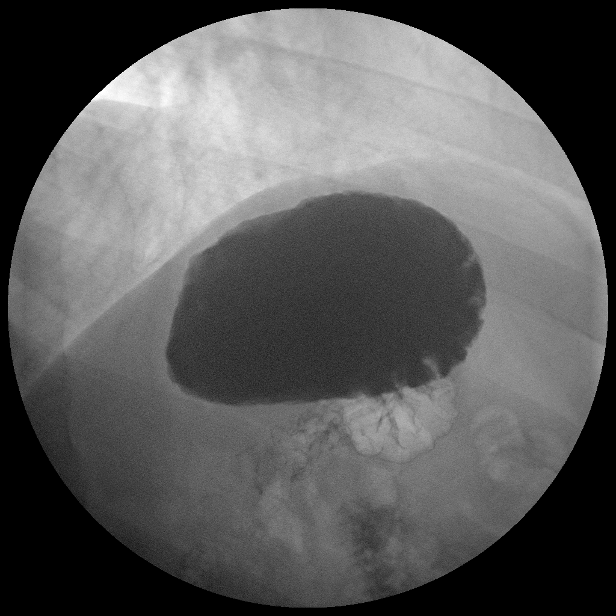

[Series 14: run · 1 of 1 slices shown (9 of 14)]
[im 1/1]
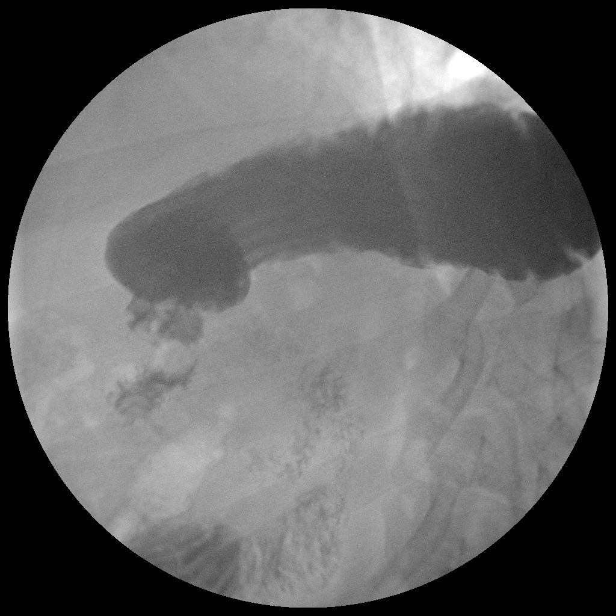

[Series 15: run · 1 of 1 slices shown (10 of 14)]
[im 1/1]
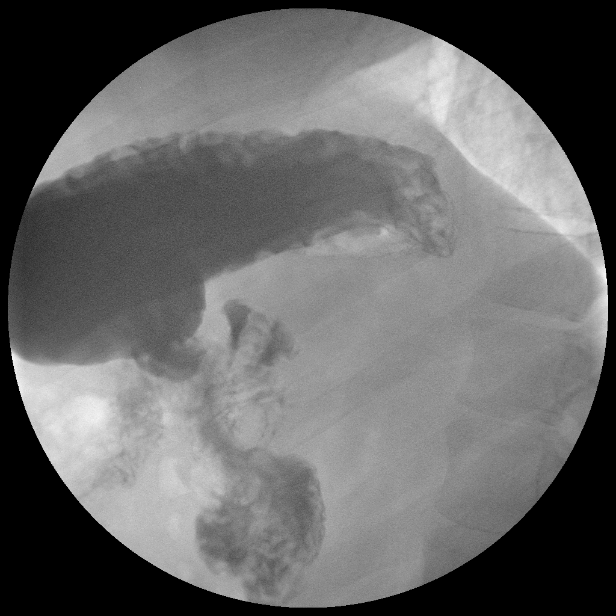

[Series 17: run · 1 of 1 slices shown (11 of 14)]
[im 1/1]
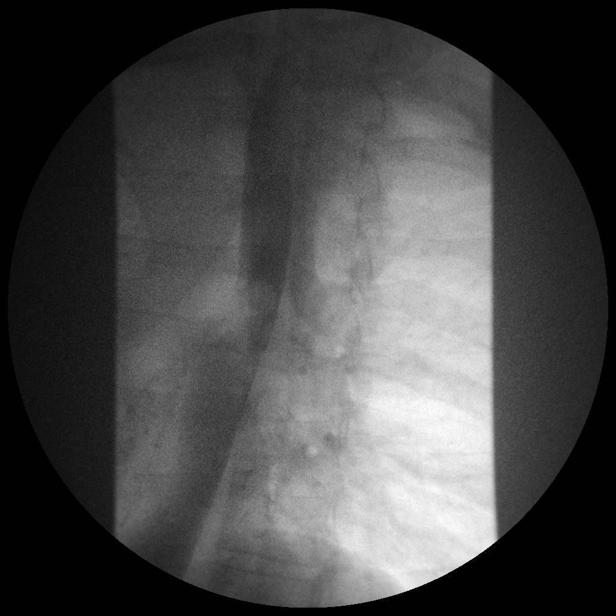

[Series 19: run · 1 of 1 slices shown (12 of 14)]
[im 1/1]
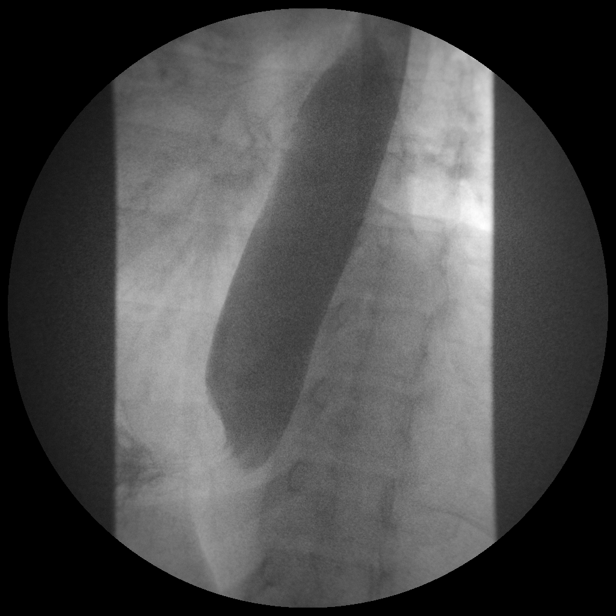

[Series 20: run · 1 of 1 slices shown (13 of 14)]
[im 1/1]
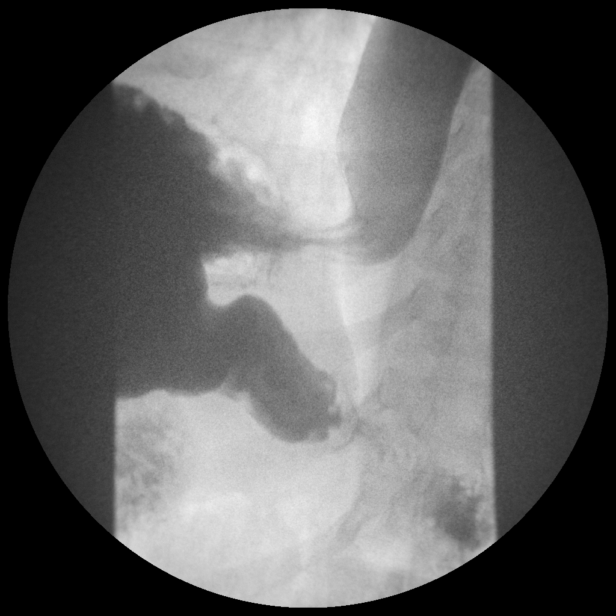

[Series 22: run · 1 of 1 slices shown (14 of 14)]
[im 1/1]
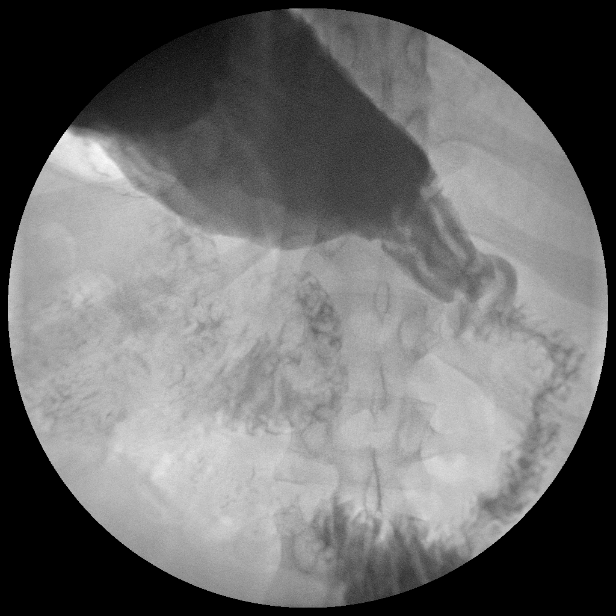

[15 of 24 positions shown; findings below may reference images not displayed]

FINDINGS: Scout film reveals normal bowel gas pattern without evidence for
mass or abnormal calcifications.

With administration of contrast there is normal esophageal
peristalsis. Gastroesophageal junction opens widely. The gastric
contour and rugal fold pattern are normal. Duodenal bulb and sweep
have a normal appearance. No evidence for malrotation.
IMPRESSION: Normal upper GI evaluation.

## 2016-10-04 ENCOUNTER — Encounter (HOSPITAL_COMMUNITY): Payer: Self-pay

## 2017-01-18 DIAGNOSIS — Z0000 Encounter for general adult medical examination without abnormal findings: Secondary | ICD-10-CM | POA: Diagnosis not present

## 2017-01-18 DIAGNOSIS — Z1389 Encounter for screening for other disorder: Secondary | ICD-10-CM | POA: Diagnosis not present

## 2017-02-01 DIAGNOSIS — R51 Headache: Secondary | ICD-10-CM | POA: Diagnosis not present

## 2017-02-01 DIAGNOSIS — I10 Essential (primary) hypertension: Secondary | ICD-10-CM | POA: Diagnosis not present

## 2017-10-04 DIAGNOSIS — G4733 Obstructive sleep apnea (adult) (pediatric): Secondary | ICD-10-CM | POA: Diagnosis not present

## 2018-01-23 DIAGNOSIS — M25562 Pain in left knee: Secondary | ICD-10-CM | POA: Diagnosis not present

## 2018-01-23 DIAGNOSIS — Z0189 Encounter for other specified special examinations: Secondary | ICD-10-CM | POA: Diagnosis not present

## 2018-02-08 ENCOUNTER — Encounter (HOSPITAL_COMMUNITY): Payer: Self-pay

## 2018-09-15 DIAGNOSIS — M79642 Pain in left hand: Secondary | ICD-10-CM | POA: Diagnosis not present

## 2018-09-15 DIAGNOSIS — M25561 Pain in right knee: Secondary | ICD-10-CM | POA: Diagnosis not present

## 2018-09-15 DIAGNOSIS — M25562 Pain in left knee: Secondary | ICD-10-CM | POA: Diagnosis not present

## 2018-09-15 DIAGNOSIS — M545 Low back pain: Secondary | ICD-10-CM | POA: Diagnosis not present

## 2018-09-20 DIAGNOSIS — M25562 Pain in left knee: Secondary | ICD-10-CM | POA: Diagnosis not present

## 2018-09-20 DIAGNOSIS — M222X2 Patellofemoral disorders, left knee: Secondary | ICD-10-CM | POA: Diagnosis not present

## 2018-09-20 DIAGNOSIS — M222X1 Patellofemoral disorders, right knee: Secondary | ICD-10-CM | POA: Diagnosis not present

## 2018-09-20 DIAGNOSIS — M25561 Pain in right knee: Secondary | ICD-10-CM | POA: Diagnosis not present

## 2018-09-27 DIAGNOSIS — M25561 Pain in right knee: Secondary | ICD-10-CM | POA: Diagnosis not present

## 2018-09-27 DIAGNOSIS — M222X2 Patellofemoral disorders, left knee: Secondary | ICD-10-CM | POA: Diagnosis not present

## 2018-09-27 DIAGNOSIS — M222X1 Patellofemoral disorders, right knee: Secondary | ICD-10-CM | POA: Diagnosis not present

## 2018-09-27 DIAGNOSIS — M25562 Pain in left knee: Secondary | ICD-10-CM | POA: Diagnosis not present

## 2018-11-17 DIAGNOSIS — R4182 Altered mental status, unspecified: Secondary | ICD-10-CM | POA: Diagnosis not present

## 2018-11-17 DIAGNOSIS — S3729XA Other injury of bladder, initial encounter: Secondary | ICD-10-CM | POA: Diagnosis not present

## 2018-11-17 DIAGNOSIS — G8918 Other acute postprocedural pain: Secondary | ICD-10-CM | POA: Diagnosis not present

## 2018-11-17 DIAGNOSIS — Y999 Unspecified external cause status: Secondary | ICD-10-CM | POA: Diagnosis not present

## 2018-11-17 DIAGNOSIS — Y92410 Unspecified street and highway as the place of occurrence of the external cause: Secondary | ICD-10-CM | POA: Diagnosis not present

## 2018-11-17 DIAGNOSIS — R1013 Epigastric pain: Secondary | ICD-10-CM | POA: Diagnosis not present

## 2018-11-17 DIAGNOSIS — N3289 Other specified disorders of bladder: Secondary | ICD-10-CM | POA: Diagnosis not present

## 2018-11-17 DIAGNOSIS — Z9884 Bariatric surgery status: Secondary | ICD-10-CM | POA: Diagnosis not present

## 2018-11-17 DIAGNOSIS — F10129 Alcohol abuse with intoxication, unspecified: Secondary | ICD-10-CM | POA: Diagnosis not present

## 2018-11-17 DIAGNOSIS — S3720XA Unspecified injury of bladder, initial encounter: Secondary | ICD-10-CM | POA: Diagnosis not present

## 2018-11-18 DIAGNOSIS — S3720XA Unspecified injury of bladder, initial encounter: Secondary | ICD-10-CM | POA: Diagnosis not present

## 2018-11-18 DIAGNOSIS — S3729XA Other injury of bladder, initial encounter: Secondary | ICD-10-CM | POA: Diagnosis not present

## 2018-11-18 DIAGNOSIS — G8918 Other acute postprocedural pain: Secondary | ICD-10-CM | POA: Diagnosis not present

## 2018-11-18 DIAGNOSIS — Y999 Unspecified external cause status: Secondary | ICD-10-CM | POA: Diagnosis not present

## 2018-11-18 DIAGNOSIS — Y92410 Unspecified street and highway as the place of occurrence of the external cause: Secondary | ICD-10-CM | POA: Diagnosis not present

## 2018-11-18 DIAGNOSIS — N3289 Other specified disorders of bladder: Secondary | ICD-10-CM | POA: Diagnosis not present

## 2018-11-27 DIAGNOSIS — N3289 Other specified disorders of bladder: Secondary | ICD-10-CM | POA: Diagnosis not present

## 2018-11-27 DIAGNOSIS — S3720XD Unspecified injury of bladder, subsequent encounter: Secondary | ICD-10-CM | POA: Diagnosis not present

## 2018-11-27 DIAGNOSIS — Y838 Other surgical procedures as the cause of abnormal reaction of the patient, or of later complication, without mention of misadventure at the time of the procedure: Secondary | ICD-10-CM | POA: Diagnosis not present

## 2018-12-18 DIAGNOSIS — X58XXXD Exposure to other specified factors, subsequent encounter: Secondary | ICD-10-CM | POA: Diagnosis not present

## 2018-12-18 DIAGNOSIS — S3720XD Unspecified injury of bladder, subsequent encounter: Secondary | ICD-10-CM | POA: Diagnosis not present

## 2019-02-12 ENCOUNTER — Other Ambulatory Visit (HOSPITAL_COMMUNITY): Payer: Self-pay | Admitting: Internal Medicine

## 2019-02-12 ENCOUNTER — Other Ambulatory Visit: Payer: Self-pay

## 2019-02-12 ENCOUNTER — Ambulatory Visit (HOSPITAL_COMMUNITY)
Admission: RE | Admit: 2019-02-12 | Discharge: 2019-02-12 | Disposition: A | Discharge status: Home / Self Care | Payer: BLUE CROSS/BLUE SHIELD | Source: Ambulatory Visit | Attending: Internal Medicine | Admitting: Internal Medicine

## 2019-02-12 ENCOUNTER — Other Ambulatory Visit: Payer: Self-pay | Admitting: Internal Medicine

## 2019-02-12 DIAGNOSIS — R609 Edema, unspecified: Secondary | ICD-10-CM

## 2019-02-12 DIAGNOSIS — M7989 Other specified soft tissue disorders: Secondary | ICD-10-CM | POA: Diagnosis not present

## 2019-02-12 NOTE — Progress Notes (Signed)
Right upper extremity venous duplex completed.Preliminary results in Chart review CV Proc. [place] Mekel Haverstock,RVS 02/12/2019, 5:44 PM

## 2019-08-20 DIAGNOSIS — Z1389 Encounter for screening for other disorder: Secondary | ICD-10-CM | POA: Diagnosis not present

## 2019-08-20 DIAGNOSIS — Z0000 Encounter for general adult medical examination without abnormal findings: Secondary | ICD-10-CM | POA: Diagnosis not present

## 2019-08-20 DIAGNOSIS — Z131 Encounter for screening for diabetes mellitus: Secondary | ICD-10-CM | POA: Diagnosis not present

## 2019-08-20 DIAGNOSIS — Z13220 Encounter for screening for lipoid disorders: Secondary | ICD-10-CM | POA: Diagnosis not present

## 2019-10-01 DIAGNOSIS — I10 Essential (primary) hypertension: Secondary | ICD-10-CM | POA: Diagnosis not present

## 2020-02-20 DIAGNOSIS — F411 Generalized anxiety disorder: Secondary | ICD-10-CM | POA: Diagnosis not present

## 2020-02-20 DIAGNOSIS — I10 Essential (primary) hypertension: Secondary | ICD-10-CM | POA: Diagnosis not present

## 2020-03-24 DIAGNOSIS — G4700 Insomnia, unspecified: Secondary | ICD-10-CM | POA: Diagnosis not present

## 2020-03-24 DIAGNOSIS — F418 Other specified anxiety disorders: Secondary | ICD-10-CM | POA: Diagnosis not present

## 2020-04-10 DIAGNOSIS — G4700 Insomnia, unspecified: Secondary | ICD-10-CM | POA: Diagnosis not present

## 2020-04-10 DIAGNOSIS — F418 Other specified anxiety disorders: Secondary | ICD-10-CM | POA: Diagnosis not present

## 2020-12-24 DIAGNOSIS — Z20828 Contact with and (suspected) exposure to other viral communicable diseases: Secondary | ICD-10-CM | POA: Diagnosis not present

## 2020-12-25 DIAGNOSIS — Z20828 Contact with and (suspected) exposure to other viral communicable diseases: Secondary | ICD-10-CM | POA: Diagnosis not present

## 2021-05-14 DIAGNOSIS — F1010 Alcohol abuse, uncomplicated: Secondary | ICD-10-CM | POA: Diagnosis not present

## 2021-05-28 DIAGNOSIS — F1010 Alcohol abuse, uncomplicated: Secondary | ICD-10-CM | POA: Diagnosis not present

## 2021-06-11 DIAGNOSIS — F1010 Alcohol abuse, uncomplicated: Secondary | ICD-10-CM | POA: Diagnosis not present

## 2021-07-02 DIAGNOSIS — F1010 Alcohol abuse, uncomplicated: Secondary | ICD-10-CM | POA: Diagnosis not present

## 2021-08-03 DIAGNOSIS — F1010 Alcohol abuse, uncomplicated: Secondary | ICD-10-CM | POA: Diagnosis not present

## 2021-08-11 DIAGNOSIS — J0100 Acute maxillary sinusitis, unspecified: Secondary | ICD-10-CM | POA: Diagnosis not present
